# Patient Record
Sex: Male | Born: 1970 | Race: Black or African American | Hispanic: No | Marital: Single | Smoking: Current every day smoker
Health system: Southern US, Community
[De-identification: ages and names within clinical notes are randomized; demographics above are authoritative.]

## PROBLEM LIST (undated history)

## (undated) DIAGNOSIS — M549 Dorsalgia, unspecified: Secondary | ICD-10-CM

## (undated) HISTORY — PX: BACK SURGERY: SHX140

---

## 1998-11-02 ENCOUNTER — Emergency Department (HOSPITAL_COMMUNITY): Admission: EM | Admit: 1998-11-02 | Discharge: 1998-11-02 | Payer: Self-pay | Admitting: Emergency Medicine

## 1998-11-15 ENCOUNTER — Emergency Department (HOSPITAL_COMMUNITY): Admission: EM | Admit: 1998-11-15 | Discharge: 1998-11-15 | Payer: Self-pay | Admitting: Emergency Medicine

## 2001-03-26 ENCOUNTER — Emergency Department (HOSPITAL_COMMUNITY): Admission: EM | Admit: 2001-03-26 | Discharge: 2001-03-26 | Payer: Self-pay | Admitting: Emergency Medicine

## 2001-07-28 ENCOUNTER — Emergency Department (HOSPITAL_COMMUNITY): Admission: EM | Admit: 2001-07-28 | Discharge: 2001-07-28 | Payer: Self-pay | Admitting: Emergency Medicine

## 2002-11-23 ENCOUNTER — Emergency Department (HOSPITAL_COMMUNITY): Admission: EM | Admit: 2002-11-23 | Discharge: 2002-11-23 | Payer: Self-pay | Admitting: *Deleted

## 2003-01-21 ENCOUNTER — Emergency Department (HOSPITAL_COMMUNITY): Admission: EM | Admit: 2003-01-21 | Discharge: 2003-01-21 | Payer: Self-pay | Admitting: *Deleted

## 2003-04-07 ENCOUNTER — Emergency Department (HOSPITAL_COMMUNITY): Admission: EM | Admit: 2003-04-07 | Discharge: 2003-04-07 | Payer: Self-pay | Admitting: Emergency Medicine

## 2003-07-17 ENCOUNTER — Emergency Department (HOSPITAL_COMMUNITY): Admission: EM | Admit: 2003-07-17 | Discharge: 2003-07-18 | Payer: Self-pay | Admitting: *Deleted

## 2004-03-23 ENCOUNTER — Emergency Department (HOSPITAL_COMMUNITY): Admission: EM | Admit: 2004-03-23 | Discharge: 2004-03-23 | Payer: Self-pay | Admitting: Emergency Medicine

## 2004-06-07 ENCOUNTER — Emergency Department (HOSPITAL_COMMUNITY): Admission: EM | Admit: 2004-06-07 | Discharge: 2004-06-07 | Payer: Self-pay | Admitting: Emergency Medicine

## 2004-06-08 ENCOUNTER — Emergency Department (HOSPITAL_COMMUNITY): Admission: EM | Admit: 2004-06-08 | Discharge: 2004-06-08 | Payer: Self-pay | Admitting: Emergency Medicine

## 2005-10-31 ENCOUNTER — Ambulatory Visit: Payer: Self-pay | Admitting: Family Medicine

## 2005-10-31 ENCOUNTER — Observation Stay (HOSPITAL_COMMUNITY): Admission: EM | Admit: 2005-10-31 | Discharge: 2005-11-01 | Payer: Self-pay | Admitting: Emergency Medicine

## 2005-10-31 ENCOUNTER — Ambulatory Visit: Payer: Self-pay | Admitting: Cardiology

## 2005-11-01 ENCOUNTER — Emergency Department (HOSPITAL_COMMUNITY): Admission: EM | Admit: 2005-11-01 | Discharge: 2005-11-01 | Payer: Self-pay | Admitting: Emergency Medicine

## 2006-07-18 ENCOUNTER — Emergency Department (HOSPITAL_COMMUNITY): Admission: EM | Admit: 2006-07-18 | Discharge: 2006-07-18 | Payer: Self-pay | Admitting: Emergency Medicine

## 2006-07-19 ENCOUNTER — Emergency Department (HOSPITAL_COMMUNITY): Admission: EM | Admit: 2006-07-19 | Discharge: 2006-07-19 | Payer: Self-pay | Admitting: Emergency Medicine

## 2007-10-23 ENCOUNTER — Emergency Department (HOSPITAL_COMMUNITY): Admission: EM | Admit: 2007-10-23 | Discharge: 2007-10-23 | Payer: Self-pay | Admitting: Emergency Medicine

## 2008-01-23 ENCOUNTER — Emergency Department (HOSPITAL_COMMUNITY): Admission: EM | Admit: 2008-01-23 | Discharge: 2008-01-23 | Payer: Self-pay | Admitting: Emergency Medicine

## 2008-05-21 ENCOUNTER — Emergency Department (HOSPITAL_COMMUNITY): Admission: EM | Admit: 2008-05-21 | Discharge: 2008-05-21 | Payer: Self-pay | Admitting: Emergency Medicine

## 2008-07-27 ENCOUNTER — Emergency Department (HOSPITAL_COMMUNITY): Admission: EM | Admit: 2008-07-27 | Discharge: 2008-07-27 | Payer: Self-pay | Admitting: Emergency Medicine

## 2008-08-23 ENCOUNTER — Emergency Department (HOSPITAL_COMMUNITY): Admission: EM | Admit: 2008-08-23 | Discharge: 2008-08-23 | Payer: Self-pay | Admitting: Emergency Medicine

## 2008-10-18 ENCOUNTER — Emergency Department (HOSPITAL_COMMUNITY): Admission: EM | Admit: 2008-10-18 | Discharge: 2008-10-18 | Payer: Self-pay | Admitting: Emergency Medicine

## 2009-01-23 ENCOUNTER — Emergency Department (HOSPITAL_COMMUNITY): Admission: EM | Admit: 2009-01-23 | Discharge: 2009-01-23 | Payer: Self-pay | Admitting: Emergency Medicine

## 2010-08-24 ENCOUNTER — Emergency Department (HOSPITAL_COMMUNITY)
Admission: EM | Admit: 2010-08-24 | Discharge: 2010-08-24 | Payer: Self-pay | Source: Home / Self Care | Admitting: Emergency Medicine

## 2010-08-24 LAB — URINALYSIS, ROUTINE W REFLEX MICROSCOPIC
Bilirubin Urine: NEGATIVE
Hemoglobin, Urine: NEGATIVE
Ketones, ur: NEGATIVE mg/dL
Nitrite: NEGATIVE
Protein, ur: NEGATIVE mg/dL
Specific Gravity, Urine: 1.02 (ref 1.005–1.030)
Urine Glucose, Fasting: NEGATIVE mg/dL
Urobilinogen, UA: 0.2 mg/dL (ref 0.0–1.0)
pH: 6 (ref 5.0–8.0)

## 2010-08-24 LAB — BASIC METABOLIC PANEL
BUN: 9 mg/dL (ref 6–23)
CO2: 27 mEq/L (ref 19–32)
Calcium: 9.3 mg/dL (ref 8.4–10.5)
Chloride: 106 mEq/L (ref 96–112)
Creatinine, Ser: 0.72 mg/dL (ref 0.4–1.5)
GFR calc Af Amer: >60 mL/min (ref >60)
GFR calc non Af Amer: >60 mL/min (ref >60)
Glucose, Bld: 101 mg/dL — ABNORMAL HIGH (ref 70–99)
Potassium: 4 mEq/L (ref 3.5–5.1)
Sodium: 140 mEq/L (ref 135–145)

## 2010-08-24 LAB — CBC
HCT: 37.8 % — ABNORMAL LOW (ref 39.0–52.0)
Hemoglobin: 12.9 g/dL — ABNORMAL LOW (ref 13.0–17.0)
MCH: 30.1 pg (ref 26.0–34.0)
MCHC: 34.1 g/dL (ref 30.0–36.0)
MCV: 88.3 fL (ref 78.0–100.0)
Platelets: 185 10*3/uL (ref 150–400)
RBC: 4.28 MIL/uL (ref 4.22–5.81)
RDW: 12.8 % (ref 11.5–15.5)
WBC: 6.7 10*3/uL (ref 4.0–10.5)

## 2010-08-24 LAB — DIFFERENTIAL
Basophils Absolute: 0 10*3/uL (ref 0.0–0.1)
Basophils Relative: 0 % (ref 0–1)
Eosinophils Absolute: 0.2 10*3/uL (ref 0.0–0.7)
Eosinophils Relative: 3 % (ref 0–5)
Lymphocytes Relative: 24 % (ref 12–46)
Lymphs Abs: 1.6 10*3/uL (ref 0.7–4.0)
Monocytes Absolute: 0.5 10*3/uL (ref 0.1–1.0)
Monocytes Relative: 7 % (ref 3–12)
Neutro Abs: 4.4 10*3/uL (ref 1.7–7.7)
Neutrophils Relative %: 65 % (ref 43–77)

## 2010-09-17 ENCOUNTER — Emergency Department (HOSPITAL_COMMUNITY)
Admission: EM | Admit: 2010-09-17 | Discharge: 2010-09-17 | Payer: Self-pay | Source: Home / Self Care | Admitting: Emergency Medicine

## 2010-11-27 LAB — URINALYSIS, ROUTINE W REFLEX MICROSCOPIC
Bilirubin Urine: NEGATIVE
Glucose, UA: NEGATIVE mg/dL
Hgb urine dipstick: NEGATIVE
Ketones, ur: NEGATIVE mg/dL
Nitrite: NEGATIVE
Protein, ur: NEGATIVE mg/dL
Specific Gravity, Urine: 1.021 (ref 1.005–1.030)
Urobilinogen, UA: 1 mg/dL (ref 0.0–1.0)
pH: 6 (ref 5.0–8.0)

## 2010-11-27 LAB — DIFFERENTIAL
Basophils Absolute: 0 10*3/uL (ref 0.0–0.1)
Basophils Relative: 1 % (ref 0–1)
Eosinophils Absolute: 0.1 10*3/uL (ref 0.0–0.7)
Eosinophils Relative: 3 % (ref 0–5)
Lymphocytes Relative: 27 % (ref 12–46)
Lymphs Abs: 1.1 10*3/uL (ref 0.7–4.0)
Monocytes Absolute: 0.4 10*3/uL (ref 0.1–1.0)
Monocytes Relative: 9 % (ref 3–12)
Neutro Abs: 2.5 10*3/uL (ref 1.7–7.7)
Neutrophils Relative %: 61 % (ref 43–77)

## 2010-11-27 LAB — BASIC METABOLIC PANEL
BUN: 9 mg/dL (ref 6–23)
CO2: 27 mEq/L (ref 19–32)
Calcium: 9 mg/dL (ref 8.4–10.5)
Chloride: 103 mEq/L (ref 96–112)
Creatinine, Ser: 0.88 mg/dL (ref 0.4–1.5)
GFR calc Af Amer: >60 mL/min (ref >60)
GFR calc non Af Amer: >60 mL/min (ref >60)
Glucose, Bld: 88 mg/dL (ref 70–99)
Potassium: 3.6 mEq/L (ref 3.5–5.1)
Sodium: 135 mEq/L (ref 135–145)

## 2010-11-27 LAB — RAPID URINE DRUG SCREEN, HOSP PERFORMED
Amphetamines: NOT DETECTED
Barbiturates: NOT DETECTED
Benzodiazepines: NOT DETECTED
Cocaine: NOT DETECTED
Opiates: NOT DETECTED
Tetrahydrocannabinol: POSITIVE — AB

## 2010-11-27 LAB — CBC
HCT: 42.5 % (ref 39.0–52.0)
Hemoglobin: 14.8 g/dL (ref 13.0–17.0)
MCHC: 34.9 g/dL (ref 30.0–36.0)
MCV: 92.1 fL (ref 78.0–100.0)
Platelets: 174 10*3/uL (ref 150–400)
RBC: 4.62 MIL/uL (ref 4.22–5.81)
RDW: 13.1 % (ref 11.5–15.5)
WBC: 4.1 10*3/uL (ref 4.0–10.5)

## 2010-11-27 LAB — POCT CARDIAC MARKERS
CKMB, poc: <1 ng/mL — ABNORMAL LOW (ref 1.0–8.0)
Myoglobin, poc: 36.2 ng/mL (ref 12–200)
Troponin i, poc: <0.05 ng/mL (ref 0.00–0.09)

## 2010-12-04 LAB — RPR: RPR Ser Ql: NONREACTIVE

## 2010-12-04 LAB — URINALYSIS, ROUTINE W REFLEX MICROSCOPIC
Bilirubin Urine: NEGATIVE
Glucose, UA: NEGATIVE mg/dL
Hgb urine dipstick: NEGATIVE
Ketones, ur: 15 mg/dL — AB
Nitrite: NEGATIVE
Protein, ur: NEGATIVE mg/dL
Specific Gravity, Urine: 1.017 (ref 1.005–1.030)
Urobilinogen, UA: 1 mg/dL (ref 0.0–1.0)
pH: 7.5 (ref 5.0–8.0)

## 2010-12-04 LAB — GC/CHLAMYDIA PROBE AMP, GENITAL
Chlamydia, DNA Probe: NEGATIVE
GC Probe Amp, Genital: NEGATIVE

## 2011-01-05 NOTE — Discharge Summary (Signed)
NAMELEDARRIUS, BEAUCHAINE NO.:  [id number]   MEDICAL RECORD NO.:  [id number]          PATIENT TYPE:  INP   LOCATION:  3736                         FACILITY:  [facility]   PHYSICIAN:  Alanson Puls, M.D.    DATE OF BIRTH:  1971-05-05   DATE OF ADMISSION:  10/31/2005  DATE OF DISCHARGE:                                 DISCHARGE SUMMARY   ADDENDUM:  The patient's bradycardia was asymptomatic and there is no  history of syncopal episodes, however, an ACE level was checked prior to  discharge and is pending.  He will follow up with [facility] with  this issue.      Alanson Puls, M.D.     MR/MEDQ  D:  11/01/2005  T:  11/02/2005  Job:  [id number]

## 2011-01-05 NOTE — H&P (Signed)
[name], [name] NO.:  [id number]   MEDICAL RECORD NO.:  [id number]          PATIENT TYPE:  EMS   LOCATION:  MAJO                         FACILITY:  [facility]   PHYSICIAN:  Bryan Ortega, M.D.    DATE OF BIRTH:  1971/06/02   DATE OF ADMISSION:  10/31/2005  DATE OF DISCHARGE:                                HISTORY & PHYSICAL   CHIEF COMPLAINT:  Chest pain.   HISTORY OF PRESENT ILLNESS:  This is a 40 year old African-American male  with no known past medical history who presents to the emergency department  this morning with chest pain beginning at approximately 7 a.m.  It is now  currently 1:30 p.m.  The pain is described as a squeezing pain radiating to  the left arm and associated with diaphoresis and shortness of breath.  The  pain does worsen with deep inspiration.  The chest pain began while lifting  objects at work.  He has not had any nausea or vomiting and no recent  illnesses.  He does have a history of cocaine and marijuana usage with his  last use of cocaine reportedly approximately 1 week ago, which was a small  amount, worth $5.00.  He reports the pain is improved but not gone after  having one dose of IV Ativan.  It did not improve with sublingual  nitroglycerin.  He currently rates it as a 5/10.   ALLERGIES:  None known.   CURRENT MEDICATIONS:  None known.   PAST MEDICAL AND SURGICAL HISTORY:  None known.   FAMILY HISTORY:  Mom and brother with hypertension and hypercholesterolemia.  There is no history of coronary artery disease, CVA or myocardial  infarction.   SOCIAL HISTORY:  The patient lives in [place] with his girlfriend.  He  has 3 children in [place].  He works in a [employer].  Occasional cocaine,  and marijuana usage more regular.  Smokes approximately 4-5 cigarettes per  day.  He has a history of alcohol abuse; however, he reports quitting  alcohol usage approximately 8 months ago.   PHYSICAL EXAMINATION:  VITAL SIGNS:   Temperature of 98.3, blood pressure  119/77, pulse 48, respiratory rate 20, O2 saturation 99% on room air.  GENERAL:  He is alert, in no apparent distress.  HEENT:  Left pupil 4 mm, right pupil 3 mm; both reactive to light.  Oropharynx clear.  CARDIOVASCULAR:  Bradycardic with no murmurs, rubs, or gallops.  LUNGS:  Clear to auscultation bilaterally.  ABDOMEN:  Positive bowel sounds, soft, nontender, nondistended.  EXTREMITIES:  No edema.  NEUROLOGIC:  Grossly intact.  Cranial nerves intact.  EKG shows sinus  bradycardia with first degree AV block and nonspecific ST-T wave changes.   LABORATORY DATA:  White blood cell count of 5.1, hemoglobin of 14.6,  platelet count of 214.  D-dimer was less than 0.22.  UDS was positive for  cocaine and marijuana.  PT 13.1, INR 1.0, PTT 29.  Point of care markers  shows a myoglobin first of 80.1 dated _________then greater than 500.  CK-MB  was less than 1.0, then 2.0,  then 7.8.  Troponin was less than 0.05 x3.  Other labs are pending at the time of dictation.   ASSESSMENT AND PLAN:  A 40 year old African-American male with chest pain  and history of cocaine abuse.  1.  Chest pain, atypical in nature, with some pleuritic qualities to it, as      well.  Possible cardiac in nature, considering cocaine use and point of      care marker elevation.  Will begin heparin and consult cardiology.  May      need nitroglycerin drip, as the patient is still reporting chest pain.      Will get serial enzymes and repeat EKG and check fasting lipid profile      in the morning, as well.  2.  Substance abuse.  Will discuss smoking cessation with the patient and      consult social work for cocaine and marijuana usage.      Bonnee Quin, MD    ______________________________  Arnette Norris Sheffield Ortega, M.D.    AD/MEDQ  D:  10/31/2005  T:  10/31/2005  Job:  [id number]

## 2011-01-05 NOTE — Consult Note (Signed)
NAMETAMI, BARREN NO.:  [id number]   MEDICAL RECORD NO.:  [id number]          PATIENT TYPE:  EMS   LOCATION:  MAJO                         FACILITY:  [facility]   PHYSICIAN:  Olga Millers, M.D. LHCDATE OF BIRTH:  1971/01/01   DATE OF CONSULTATION:  10/31/2005  DATE OF DISCHARGE:                                   CONSULTATION   PRIMARY CARDIOLOGIST:  Olga Millers, M.D., new.   REASON FOR CONSULTATION:  Mr. Grafton is a 40 year old male with no prior  cardiac history who is now referred in consultation for evaluation of chest  pain.   The patient has no significant cardiac risk factors, except tobacco, but is  also found to be positive for cocaine by current urine drug screen.   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  While  lifting the door, however, he did not note any pulling sensation or pain.  It was only after he laid down the door that he felt this severe pain, which  he rated a 9/10, and which was associated with some mild dyspnea and  diaphoresis.  However, there was no radiation to the upper extremities or  jaw or any associated nausea/vomiting.   The patient was taken to the emergency room by one of his coworkers.  He was  treated with 1 sublingual nitroglycerin with no effect.  He has been here  since early this morning and is currently reporting improvement of the pain,  because he is laying still, to a current level of 2/10.   Admission electrocardiogram showed sinus bradycardia at 50 beats per minute  with first degree atrioventricular block and early repolarization changes.   Of note, the patient reports exacerbation of his chest pain if he takes a  deep breath or moves a certain way.  He denies any antecedent history of  exertional chest discomfort or dyspnea.   ALLERGIES:  No known drug allergies.   HOME MEDICATIONS:  None.   PAST  MEDICAL HISTORY:  Childhood murmur.  The patient denies any  hospitalizations or surgeries.   SOCIAL HISTORY:  The patient lives here in [place] with his girlfriend.  He has 3 children.  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  He has been smoking at least half a pack a day since the age of  66.  He has not had any alcohol for the last 8 months, but does admit to  drinking 4 beers a night in the past.  He denies history of abuse or  drinking hard liquor.   FAMILY HISTORY:  Mother is 85, present with him in the room, with no heart  disease.  Father died in his 29s, with no known heart disease.  Siblings  have no known heart disease.   REVIEW OF SYSTEMS:  As noted per HPI.  The patient denies any history of  exertional chest discomfort or dyspnea.  Denies paroxysmal nocturnal  dyspnea, orthopnea, lower extremity edema, tachypalpitations, upper or lower  GI bleeding, fever, chills, sweats or symptoms of reflux disease.  The  remaining systems are negative.   PHYSICAL EXAMINATION:  VITAL SIGNS:  Blood pressure of 119/77, pulse 48,  temperature 98.3, saturations 98% on room air on admission.  GENERAL:  A 40 year old male lying supine in bed, in no apparent distress.  HEENT:  Normocephalic and atraumatic.  NECK:  Preserved carotid pulses without bruits.  No JVD.  LUNGS:  Clear to auscultation in all fields.  HEART:  Regular rate and rhythm (S1 and S2).  No murmurs, rubs, or gallops.  ABDOMEN:  Soft, nontender.  Intact bowel sounds.  EXTREMITIES:  Palpable bilateral femoral pulses without bruits.  Intact  distal pulses.  No edema.  NEUROLOGIC:  No focal deficits.   ADMISSION CHEST X-RAY:  No acute disease.   ADMISSION ELECTROCARDIOGRAM:  Sinus bradycardia at 50 beats per minute with  normal axis.  Q waves in leads V1 and V2.  First degree AV block with a PR  interval of 321 ms.  ST changes suggestive of early repolarization.   LABORATORY DATA:  Initial serial  cardiac markers have been negative,  including the initial 2 sets of POC enzymes.  CBC is normal.  Sodium 136,  potassium  3.8, BUN 9, creatinine 0.9, glucose 84.  Mildly elevated SGPT at  44; otherwise normal.  Liver enzymes revealed an INR of 1.0.  D-dimer of  less than 0.22.  Urine drug screen positive for cocaine and cannabinoids.   IMPRESSION:  1.  Atypical chest pain.      1.  Most consistent with musculoskeletal etiology.  2.  Polysubstance abuse.      1.  Cocaine, tobacco.  3.  Sinus bradycardia/first degree atrioventricular block.   PLAN:  The patient's symptoms are quite atypical for ischemic heart disease.  However, in light of his history of cocaine abuse, he is at risk for  accelerated coronary atherosclerosis.  Therefore, the recommendation is to  admit for rule out of myocardial infarction, and, if serial cardiac markers  are negative, proceed with an outpatient stress Myoview study.  Regarding  medications, we recommend the addition of aspirin, but at this point see no  indication for intravenous heparin.  The patient will also need aggressive  counseling.  We agree with checking fasting lipid profile, repeating  electrocardiogram in the morning, and checking a TSH level, as well.  The  patient will also need aggressive counseling regarding polysubstance abuse.      Gene Serpe, P.A. [facility]    ______________________________  Olga Millers, M.D. [facility]    GS/MEDQ  D:  10/31/2005  T:  10/31/2005  Job:  [id number]

## 2011-01-05 NOTE — Discharge Summary (Signed)
NAMEDARRYEL, DIODATO NO.:  [id number]   MEDICAL RECORD NO.:  [id number]          PATIENT TYPE:  INP   LOCATION:  3736                         FACILITY:  [facility]   PHYSICIAN:  Wayne A. Sheffield Slider, M.D.    DATE OF BIRTH:  07/16/1971   DATE OF ADMISSION:  10/31/2005  DATE OF DISCHARGE:  11/01/2005                                 DISCHARGE SUMMARY   ADMISSION DIAGNOSES:  1.  Atypical chest pain.  2.  History of substance abuse, including cocaine and marijuana.   DISCHARGE DIAGNOSES:  1.  Atypical chest pain, likely musculoskeletal in origin.  2.  Substance abuse, including cocaine, marijuana, as well as tobacco abuse.   CONSULTATIONS:  [facility] on October 31, 2005.   PROCEDURES:  1.  Chest x-ray, which revealed no acute cardiopulmonary process.  2.  EKG which revealed sinus bradycardia with a rate of 40 beats per minute      with a primary AV block, P-R interval 322.  No previous EKG to compare.   HOSPITAL COURSE:  In brief, Mr. Hoffmann is a 40 year old male who was admitted  to the emergency department with substernal chest pain that was squeezing  and radiating to his left arm and associated with diaphoresis and shortness  of breath.  The patient states that the pain was worse with movement as well  as deep inspiration.  He states that the pain began while lifting some  objects at work.  He denies any nausea, vomiting, or recent illnesses but  did admit to cocaine and marijuana usage one week prior to this admission.  The patient states his pain was not relieved with nitroglycerin.  His  initial EKG showed sinus bradycardia with a rate of 40-45 beats per minute  with a primary AV block with a P-R interval of 322.  There were no acute ST  changes.  His EKG the next morning was also consistent with these findings.  The patient's initial point-of-care enzymes did increase while in the  emergency department; therefore, the patient was shortly placed on heparin  and examined by cardiology.  His official cardiac enzymes were as follows:  Troponin 0.01.  Second set 0.01.  Third set 0.01.  Therefore, this was not  considered an acute myocardial infarction.  The patient's initial chest x-  ray did not show any signs of active disease; therefore, his heparin was  discontinued because this was likely not a cardiac event.  Also, the  differential diagnosis included pulmonary embolism.  Patient's D-dimer was  less than 0.22, and he had a low pretest probability of pulmonary embolism.  In order to further risk stratify the patient, a fasting lipid panel was  obtained, which revealed a total cholesterol of 156, triglycerides 90, LDL  106, and HDL of 32.  In order to further characterize the bradycardia, the  patient's electrolytes were within normal limits, including sodium 138,  potassium 4.1, chloride 101, bicarb 28, BUN 10, creatinine 0.9, and a  glucose of 87.  A thyroid panel was checked, which revealed a normal thyroid  function at 4.384.  DISCHARGE MEDICATIONS:  Ibuprofen over-the-counter, per bottle instructions.   He was instructed to follow up with Dr. Jens Som of [facility] on  March 23 at 8:30 a.m. for a stress test treadmill and given the number, [phone]  [phone].  Social work consult was also obtained prior to discharge to encourage  the patient on smoking cessation as well as ways to deal with his substance  abuse.  He was instructed to return to the emergency department or clinic  for shortness of breath, persistent chest pain, or any other concerns.      Alanson Puls, M.D.    ______________________________  Arnette Norris. Sheffield Slider, M.D.    MR/MEDQ  D:  11/01/2005  T:  11/02/2005  Job:  [id number]

## 2011-05-14 ENCOUNTER — Emergency Department (HOSPITAL_COMMUNITY): Payer: Self-pay

## 2011-05-14 ENCOUNTER — Emergency Department (HOSPITAL_COMMUNITY)
Admission: EM | Admit: 2011-05-14 | Discharge: 2011-05-15 | Disposition: A | Discharge status: Home / Self Care | Payer: Self-pay | Attending: Emergency Medicine | Admitting: Emergency Medicine

## 2011-05-14 DIAGNOSIS — F17200 Nicotine dependence, unspecified, uncomplicated: Secondary | ICD-10-CM | POA: Insufficient documentation

## 2011-05-14 DIAGNOSIS — N509 Disorder of male genital organs, unspecified: Secondary | ICD-10-CM | POA: Insufficient documentation

## 2011-05-14 LAB — URINALYSIS, ROUTINE W REFLEX MICROSCOPIC
Bilirubin Urine: NEGATIVE
Glucose, UA: NEGATIVE mg/dL
Hgb urine dipstick: NEGATIVE
Ketones, ur: NEGATIVE mg/dL
Leukocytes, UA: NEGATIVE
Nitrite: NEGATIVE
Protein, ur: NEGATIVE mg/dL
Specific Gravity, Urine: 1.016 (ref 1.005–1.030)
Urobilinogen, UA: 0.2 mg/dL (ref 0.0–1.0)
pH: 6 (ref 5.0–8.0)

## 2011-07-16 ENCOUNTER — Emergency Department (HOSPITAL_COMMUNITY)
Admission: EM | Admit: 2011-07-16 | Discharge: 2011-07-16 | Disposition: A | Discharge status: Home / Self Care | Payer: Self-pay | Attending: Emergency Medicine | Admitting: Emergency Medicine

## 2011-07-16 ENCOUNTER — Encounter: Payer: Self-pay | Admitting: *Deleted

## 2011-07-16 DIAGNOSIS — M545 Low back pain, unspecified: Secondary | ICD-10-CM | POA: Insufficient documentation

## 2011-07-16 DIAGNOSIS — M79609 Pain in unspecified limb: Secondary | ICD-10-CM | POA: Insufficient documentation

## 2011-07-16 DIAGNOSIS — M25569 Pain in unspecified knee: Secondary | ICD-10-CM | POA: Insufficient documentation

## 2011-07-16 DIAGNOSIS — M79605 Pain in left leg: Secondary | ICD-10-CM

## 2011-07-16 MED ORDER — IBUPROFEN 800 MG PO TABS
ORAL_TABLET | ORAL | Status: AC
  Administered 2011-07-16: 800 mg via ORAL
  Filled 2011-07-16: qty 1

## 2011-07-16 MED ORDER — IBUPROFEN 800 MG PO TABS: 800.0000 mg | ORAL_TABLET | Freq: Three times a day (TID) | ORAL | Status: DC | PRN

## 2011-07-16 MED ORDER — CYCLOBENZAPRINE HCL 10 MG PO TABS: 10.0000 mg | ORAL_TABLET | Freq: Two times a day (BID) | ORAL | Status: DC | PRN

## 2011-07-16 NOTE — ED Notes (Signed)
Reports lower back pain and left leg pain, denies injury to back. Ambulatory at triage.

## 2011-07-16 NOTE — ED Provider Notes (Signed)
History    patient presents to the ED with a chief complaints of left thigh pain. Patient states for the past week he's been noticing pain from left thigh/knee radiating to his left low back. Pain is throbbing and achy and worsening with position change. Patient notice more pain with sitting or bending.  He denies dysuria, rash, recent trauma, numbness or weakness. He denies midback pain, fever, abdominal pain. He has tried taking saw him ibuprofen without relief. He does notice increasing pain with movement.  CSN: [id number] Arrival date & time: 07/16/2011  8:58 AM   First MD Initiated Contact with Patient 07/16/11 [phone]      Chief Complaint  Patient presents with  . Back Pain  . Leg Pain    (Consider location/radiation/quality/duration/timing/severity/associated sxs/prior treatment) Patient is a 40 y.o. male presenting with leg pain. The history is provided by the patient. No language interpreter was used.  Leg Pain  The incident occurred more than 1 week ago. There was no injury mechanism. The pain is present in the left thigh. The quality of the pain is described as aching and throbbing. The pain is at a severity of 5/10. The pain is moderate. The pain has been intermittent since onset. Pertinent negatives include no numbness, no inability to bear weight, no loss of motion, no muscle weakness, no loss of sensation and no tingling. He has tried NSAIDs for the symptoms. The treatment provided mild relief.    History reviewed. No pertinent past medical history.  History reviewed. No pertinent past surgical history.  History reviewed. No pertinent family history.  History  Substance Use Topics  . Smoking status: Not on file  . Smokeless tobacco: Not on file  . Alcohol Use: No      Review of Systems  Neurological: Negative for tingling and numbness.  All other systems reviewed and are negative.    Allergies  Review of patient's allergies indicates no known allergies.  Home  Medications  No current outpatient prescriptions on file.  BP 120/65  Pulse 51  Temp(Src) 97 F (36.1 C) (Oral)  Resp 20  SpO2 100%  Physical Exam  Nursing note and vitals reviewed. Constitutional: He is oriented to person, place, and time. He appears well-developed and well-nourished.       Awake, alert, nontoxic appearance  HENT:  Head: Atraumatic.  Eyes: Right eye exhibits no discharge. Left eye exhibits no discharge.  Neck: Neck supple.  Pulmonary/Chest: Effort normal. He exhibits no tenderness.  Abdominal: Soft. Bowel sounds are normal. There is no tenderness. There is no rebound.  Musculoskeletal: Normal range of motion. He exhibits no tenderness.       Left hip: Normal.       Left knee: Normal.       Lumbar back: Normal.       Left upper leg: He exhibits tenderness. He exhibits no bony tenderness, no swelling, no deformity and no laceration.       Baseline ROM, no obvious new focal weakness  Neurological: He is oriented to person, place, and time. He displays normal reflexes. He exhibits normal muscle tone. Coordination normal.       Mental status and motor strength appears baseline for patient and situation  Skin: No rash noted.  Psychiatric: He has a normal mood and affect.    ED Course  Procedures (including critical care time)  Labs Reviewed - No data to display No results found.   No diagnosis found.    MDM  Patient with  left thigh pain reproducible with range of motion. He has no lumbar midline tenderness. He has no complaints of urinary symptoms or evidence of hernia. No CVA tenderness. Pain is likely musculoskeletal. Patient has normal patellar deep tendon reflex and no evidence of foot drop. I will prescribe NSAIDs and muscle relaxant. Patient is afebrile.      Fayrene Helper, PA 07/16/11 1012

## 2011-07-16 NOTE — ED Provider Notes (Signed)
Medical screening examination/treatment/procedure(s) were performed by non-physician practitioner and as supervising physician I was immediately available for consultation/collaboration.    Valiant Dills R Meliyah Simon, MD 07/16/11 1646 

## 2011-07-18 ENCOUNTER — Encounter (HOSPITAL_COMMUNITY): Payer: Self-pay | Admitting: *Deleted

## 2011-07-18 ENCOUNTER — Emergency Department (HOSPITAL_COMMUNITY): Payer: Self-pay

## 2011-07-18 ENCOUNTER — Emergency Department (HOSPITAL_COMMUNITY)
Admission: EM | Admit: 2011-07-18 | Discharge: 2011-07-18 | Disposition: A | Discharge status: Home / Self Care | Payer: Self-pay | Attending: Emergency Medicine | Admitting: Emergency Medicine

## 2011-07-18 DIAGNOSIS — IMO0001 Reserved for inherently not codable concepts without codable children: Secondary | ICD-10-CM | POA: Insufficient documentation

## 2011-07-18 DIAGNOSIS — M79609 Pain in unspecified limb: Secondary | ICD-10-CM | POA: Insufficient documentation

## 2011-07-18 DIAGNOSIS — M5430 Sciatica, unspecified side: Secondary | ICD-10-CM | POA: Insufficient documentation

## 2011-07-18 MED ORDER — DIAZEPAM 5 MG PO TABS: 5.0000 mg | ORAL_TABLET | Freq: Two times a day (BID) | ORAL | Status: DC

## 2011-07-18 MED ORDER — PREDNISONE 10 MG PO TABS: 20.0000 mg | ORAL_TABLET | Freq: Every day | ORAL | Status: DC

## 2011-07-18 MED ORDER — DEXAMETHASONE SODIUM PHOSPHATE 10 MG/ML IJ SOLN
10.0000 mg | Freq: Once | INTRAMUSCULAR | Status: AC
  Administered 2011-07-18: 10 mg via INTRAMUSCULAR
  Filled 2011-07-18: qty 1

## 2011-07-18 MED ORDER — DIAZEPAM 5 MG/ML IJ SOLN
5.0000 mg | Freq: Once | INTRAMUSCULAR | Status: AC
  Administered 2011-07-18: 5 mg via INTRAMUSCULAR
  Filled 2011-07-18: qty 2

## 2011-07-18 NOTE — ED Notes (Signed)
Pt has taken all 10 Flexeril since seen 2 days ago without relief. No known injury to left leg--was told it 'was muscles'.

## 2011-07-18 NOTE — ED Notes (Signed)
C/o leg pain, onset 2 weeks ago, was seen here 2d ago, pinpoint pain to L posterior thigh up to L buttocks. Has taken prescribed meds: ibuprofen and flexeril.

## 2011-07-18 NOTE — ED Notes (Signed)
REPORTED OFF TO FOLLOWING SHIFT.

## 2011-07-18 NOTE — ED Provider Notes (Signed)
Medical screening examination/treatment/procedure(s) were performed by non-physician practitioner and as supervising physician I was immediately available for consultation/collaboration.  Fletcher Rathbun L Retia Cordle, MD 07/18/11 2053 

## 2011-07-18 NOTE — ED Provider Notes (Signed)
History     CSN: [id number] Arrival date & time: 07/18/2011  5:13 AM   First MD Initiated Contact with Patient 07/18/11 [phone]      Chief Complaint  Patient presents with  . Leg Pain    (Consider location/radiation/quality/duration/timing/severity/associated sxs/prior treatment) HPI Comments: Patient here with worsening posterior left leg pain - states has been here 2 days prior for same symptoms - reports crampy pain to posterior leg from buttocks to knee - denies any calf pain - no improvement with medications - reports pain worse with movement, but episodic in nature.  Patient is a 40 y.o. male presenting with leg pain. The history is provided by the patient. No language interpreter was used.  Leg Pain  The incident occurred more than 2 days ago. The incident occurred at home. There was no injury mechanism. The pain is present in the left leg. The quality of the pain is described as burning and sharp. The pain is at a severity of 5/10. The pain is mild. The pain has been fluctuating since onset. Pertinent negatives include no numbness, no inability to bear weight, no loss of motion, no muscle weakness, no loss of sensation and no tingling. He reports no foreign bodies present. The symptoms are aggravated by nothing. He has tried NSAIDs for the symptoms. The treatment provided no relief.    History reviewed. No pertinent past medical history.  History reviewed. No pertinent past surgical history.  History reviewed. No pertinent family history.  History  Substance Use Topics  . Smoking status: Not on file  . Smokeless tobacco: Not on file  . Alcohol Use: No      Review of Systems  Neurological: Negative for tingling and numbness.  All other systems reviewed and are negative.    Allergies  Review of patient's allergies indicates no known allergies.  Home Medications   Current Outpatient Rx  Name Route Sig Dispense Refill  . CYCLOBENZAPRINE HCL 10 MG PO TABS Oral Take 1  tablet (10 mg total) by mouth 2 (two) times daily as needed for muscle spasms. 10 tablet 0  . IBUPROFEN 800 MG PO TABS Oral Take 1 tablet (800 mg total) by mouth every 8 (eight) hours as needed for pain. 30 tablet 0    BP 118/78  Pulse 69  Temp(Src) 97.6 F (36.4 C) (Oral)  Resp 20  SpO2 100%  Physical Exam  Nursing note and vitals reviewed. Constitutional: He is oriented to person, place, and time. He appears well-developed and well-nourished.  HENT:  Head: Normocephalic and atraumatic.  Right Ear: External ear normal.  Left Ear: External ear normal.  Mouth/Throat: Oropharynx is clear and moist.  Eyes: Conjunctivae are normal. Pupils are equal, round, and reactive to light.  Neck: Normal range of motion. Neck supple.  Cardiovascular: Normal rate, regular rhythm and normal heart sounds.   Pulmonary/Chest: Effort normal and breath sounds normal.  Abdominal: Soft. Bowel sounds are normal.  Musculoskeletal: Normal range of motion.       Left upper leg: He exhibits tenderness. He exhibits no bony tenderness, no swelling, no edema and no deformity.       Legs: Neurological: He is alert and oriented to person, place, and time.  Skin: Skin is warm and dry.  Psychiatric: He has a normal mood and affect. His behavior is normal. Judgment and thought content normal.    ED Course  Procedures (including critical care time)  Labs Reviewed - No data to display No results found.  Sciatica   MDM  Tenderness to muscle with movement - no SI joint tenderness but the pain starts in the buttocks suggesting sciatica in nature - will get lower back x-ray to make sure no pathology noted there.  Doubt DVT because of the episodic nature of the pain, and the cramping.    Patient reports no improvement in pain despite valium and steroids - still believe this to be sciatica - will continues current course, steroids.    Izola Price [place], [place] 07/18/11 [phone]

## 2011-07-19 ENCOUNTER — Encounter (HOSPITAL_COMMUNITY): Payer: Self-pay | Admitting: *Deleted

## 2011-07-19 ENCOUNTER — Emergency Department (HOSPITAL_COMMUNITY)
Admission: EM | Admit: 2011-07-19 | Discharge: 2011-07-19 | Disposition: A | Discharge status: Home / Self Care | Payer: Self-pay | Attending: Emergency Medicine | Admitting: Emergency Medicine

## 2011-07-19 DIAGNOSIS — M545 Low back pain, unspecified: Secondary | ICD-10-CM | POA: Insufficient documentation

## 2011-07-19 DIAGNOSIS — M79605 Pain in left leg: Secondary | ICD-10-CM

## 2011-07-19 DIAGNOSIS — M79609 Pain in unspecified limb: Secondary | ICD-10-CM | POA: Insufficient documentation

## 2011-07-19 MED ORDER — OXYCODONE-ACETAMINOPHEN 5-325 MG PO TABS: ORAL_TABLET | ORAL | Status: DC

## 2011-07-19 NOTE — ED Notes (Signed)
Pt is here for re evaluation of left leg pain.  Pt states that it continues to get worse.  No swelling to leg, not associated with any trauma.  Pt is ambulatory.  No fever

## 2011-07-19 NOTE — ED Provider Notes (Signed)
History     CSN: [id number] Arrival date & time: 07/19/2011  5:25 PM   First MD Initiated Contact with Patient 07/19/11 2032      Chief Complaint  Patient presents with  . Leg Pain    (Consider location/radiation/quality/duration/timing/severity/associated sxs/prior treatment) Patient is a 40 y.o. male presenting with leg pain. The history is provided by the patient.  Leg Pain  The incident occurred more than 2 days ago. Incident location: No known injury. There was no injury mechanism. The pain is present in the left thigh. The pain is at a severity of 10/10. The pain is moderate. Pertinent negatives include no numbness. Exacerbated by: Nothing makes it worse or better - pain is constant.     History reviewed. No pertinent past medical history.  History reviewed. No pertinent past surgical history.  History reviewed. No pertinent family history.  History  Substance Use Topics  . Smoking status: Former Games developer  . Smokeless tobacco: Not on file  . Alcohol Use: No      Review of Systems  Constitutional: Negative for fever and chills.  HENT: Negative.   Respiratory: Negative.   Cardiovascular: Negative.   Gastrointestinal: Negative.   Musculoskeletal: Negative.        See HPI  Skin: Negative.   Neurological: Negative.  Negative for numbness.    Allergies  Review of patient's allergies indicates no known allergies.  Home Medications   Current Outpatient Rx  Name Route Sig Dispense Refill  . CYCLOBENZAPRINE HCL 10 MG PO TABS Oral Take 10 mg by mouth 2 (two) times daily as needed. For muscle spasms     . DIAZEPAM 5 MG PO TABS Oral Take 5 mg by mouth 2 (two) times daily.      . IBUPROFEN 800 MG PO TABS Oral Take 800 mg by mouth every 8 (eight) hours as needed. For pain     . ICY HOT 10-30 % EX STCK Apply externally Apply 1 application topically 2 (two) times daily.      Marland Kitchen PREDNISONE 10 MG PO TABS Oral Take 20 mg by mouth daily.        BP 111/67  Pulse 94   Temp(Src) 98 F (36.7 C) (Oral)  Resp 14  SpO2 96%  Physical Exam  Constitutional: He is oriented to person, place, and time. He appears well-developed and well-nourished.  Neck: Normal range of motion.  Pulmonary/Chest: Effort normal.  Abdominal: Soft. He exhibits no mass. There is no tenderness.  Musculoskeletal: Normal range of motion.       Left lateral paralumbar tenderness without swelling, discoloration. No sciatic tenderness on left. Distal pulses 2+. No swelling of lower extremity.  Neurological: He is alert and oriented to person, place, and time. He has normal reflexes. No sensory deficit.  Skin: Skin is warm and dry.  Psychiatric: He has a normal mood and affect.    ED Course  Procedures (including critical care time)  Labs Reviewed - No data to display Dg Lumbar Spine Complete  07/18/2011  *RADIOLOGY REPORT*  Clinical Data: Low back and left leg pain.  LUMBAR SPINE - COMPLETE 4+ VIEW  Comparison: None.  Findings: There is mild straightening of the normal lumbar lordosis.  Alignment is otherwise anatomic.  Vertebral body and disc space height are maintained.  No significant degenerative changes.  No pars defects.  IMPRESSION: Negative.  Original Report Authenticated By: Reyes Ivan, M.D.     No diagnosis found.    MDM  The patient  has had multiple ED evaluations this week for same pain and is not improved with any medications. Plan is to refer to Dr. Luiz Blare for outpatient evaluation and will schedule outpatient MRI of lumbar spine for Dr. Luiz Blare to follow up on results.        Rodena Medin, PA 07/19/11 2124

## 2011-07-19 NOTE — ED Notes (Signed)
Pt wants to talk to supervisor regarding the care he has received. States that pain pills are doing nothing for his leg pain

## 2011-07-19 NOTE — ED Provider Notes (Signed)
Medical screening examination/treatment/procedure(s) were performed by non-physician practitioner and as supervising physician I was immediately available for consultation/collaboration.  Lylian Sanagustin K Linker, MD 07/19/11 2209 

## 2011-07-21 ENCOUNTER — Ambulatory Visit (HOSPITAL_COMMUNITY)
Admission: RE | Admit: 2011-07-21 | Discharge: 2011-07-21 | Disposition: A | Discharge status: Home / Self Care | Payer: Medicaid Other | Source: Ambulatory Visit | Attending: Emergency Medicine | Admitting: Emergency Medicine

## 2011-07-21 ENCOUNTER — Other Ambulatory Visit (HOSPITAL_COMMUNITY): Payer: Self-pay | Admitting: Emergency Medicine

## 2011-07-21 ENCOUNTER — Encounter (HOSPITAL_COMMUNITY): Payer: Self-pay

## 2011-07-21 ENCOUNTER — Emergency Department (HOSPITAL_COMMUNITY)
Admission: EM | Admit: 2011-07-21 | Discharge: 2011-07-21 | Disposition: A | Discharge status: Home / Self Care | Payer: Self-pay | Attending: Emergency Medicine | Admitting: Emergency Medicine

## 2011-07-21 DIAGNOSIS — M5430 Sciatica, unspecified side: Secondary | ICD-10-CM | POA: Insufficient documentation

## 2011-07-21 DIAGNOSIS — M5417 Radiculopathy, lumbosacral region: Secondary | ICD-10-CM

## 2011-07-21 DIAGNOSIS — M545 Low back pain, unspecified: Secondary | ICD-10-CM | POA: Insufficient documentation

## 2011-07-21 DIAGNOSIS — M5126 Other intervertebral disc displacement, lumbar region: Secondary | ICD-10-CM | POA: Insufficient documentation

## 2011-07-21 DIAGNOSIS — M79609 Pain in unspecified limb: Secondary | ICD-10-CM | POA: Insufficient documentation

## 2011-07-21 DIAGNOSIS — IMO0002 Reserved for concepts with insufficient information to code with codable children: Secondary | ICD-10-CM | POA: Insufficient documentation

## 2011-07-21 DIAGNOSIS — E876 Hypokalemia: Secondary | ICD-10-CM | POA: Insufficient documentation

## 2011-07-21 LAB — COMPREHENSIVE METABOLIC PANEL
ALT: 14 U/L (ref 0–53)
AST: 9 U/L (ref 0–37)
Albumin: 3.5 g/dL (ref 3.5–5.2)
CO2: 28 mEq/L (ref 19–32)
Calcium: 9.3 mg/dL (ref 8.4–10.5)
Chloride: 105 mEq/L (ref 96–112)
Creatinine, Ser: 0.91 mg/dL (ref 0.50–1.35)
GFR calc non Af Amer: >90 mL/min (ref >90)
Sodium: 140 mEq/L (ref 135–145)
Total Bilirubin: 0.2 mg/dL — ABNORMAL LOW (ref 0.3–1.2)

## 2011-07-21 LAB — DIFFERENTIAL
Basophils Absolute: 0 10*3/uL (ref 0.0–0.1)
Basophils Relative: 0 % (ref 0–1)
Lymphocytes Relative: 21 % (ref 12–46)
Neutro Abs: 7.6 10*3/uL (ref 1.7–7.7)

## 2011-07-21 LAB — CBC
MCHC: 35.3 g/dL (ref 30.0–36.0)
Platelets: 173 10*3/uL (ref 150–400)
RDW: 13 % (ref 11.5–15.5)
WBC: 10.9 10*3/uL — ABNORMAL HIGH (ref 4.0–10.5)

## 2011-07-21 MED ORDER — SODIUM CHLORIDE 0.9 % IV SOLN
Freq: Once | INTRAVENOUS | Status: AC
  Administered 2011-07-21: 08:00:00 via INTRAVENOUS

## 2011-07-21 MED ORDER — FENTANYL CITRATE 0.05 MG/ML IJ SOLN
100.0000 ug | Freq: Once | INTRAMUSCULAR | Status: AC
  Administered 2011-07-21: 100 ug via INTRAVENOUS
  Filled 2011-07-21: qty 2

## 2011-07-21 MED ORDER — PREDNISONE 20 MG PO TABS: ORAL_TABLET | ORAL | Status: DC

## 2011-07-21 MED ORDER — DIAZEPAM 5 MG PO TABS: ORAL_TABLET | ORAL | Status: DC

## 2011-07-21 MED ORDER — HYDROMORPHONE HCL 2 MG PO TABS
2.0000 mg | ORAL_TABLET | ORAL | Status: DC | PRN
Start: 2011-07-21 — End: 2011-07-25

## 2011-07-21 MED ORDER — DEXAMETHASONE SODIUM PHOSPHATE 10 MG/ML IJ SOLN
10.0000 mg | Freq: Once | INTRAMUSCULAR | Status: AC
  Administered 2011-07-21: 10 mg via INTRAVENOUS
  Filled 2011-07-21: qty 1

## 2011-07-21 MED ORDER — ONDANSETRON HCL 4 MG/2ML IJ SOLN
4.0000 mg | Freq: Once | INTRAMUSCULAR | Status: AC
  Administered 2011-07-21: 4 mg via INTRAVENOUS
  Filled 2011-07-21: qty 2

## 2011-07-21 NOTE — ED Notes (Signed)
Pt hypotensive.  Notified. Dr. Lynelle Doctor.  OK to give fentanyl per Dr. Lynelle Doctor. Also administering 1Liter bolus nss.   Pt reports took several percocet during the night.  EDP aware.

## 2011-07-21 NOTE — ED Notes (Signed)
Pt reports woke up Monday with pain in posterior left thigh.  Denies injury.  Says feels like has a cramp in his thigh.  Reports went to [facility] and was given pain meds but says nothing is helping.

## 2011-07-21 NOTE — ED Notes (Signed)
Dr. Lynelle Doctor aware of increase in bp and is aware pain is unchanged.

## 2011-07-21 NOTE — ED Notes (Signed)
Pt c/o left thigh pain since Monday.  Says feels like a cramp that won't go away.  Reports no injury. Pedal pulses present, skin warm and dry.

## 2011-07-21 NOTE — ED Provider Notes (Signed)
History  Scribed for Ward Givens, MD, the patient was [facility]. The chart was scribed by Gilman Schmidt. The patients care was started at 9:30 AM.   CSN: [id number] Arrival date & time: 07/21/2011  7:26 AM   First MD Initiated Contact with Patient 07/21/11 0740      Chief Complaint  Patient presents with  . Leg Pain     HPI Bryan Ortega is a 40 y.o. male who presents to the Emergency Department complaining of leg pain. Pt reports waking up Monday with pain radiating from buttock into left posterior thigh and down to calf. Pain is described as a cramp sensation. Symptoms are exacerbated upon laying on left  side, moving, and walking. Symptoms are alleviated by laying on opposite side. States that pain did not wake him from sleep. Denies any injury. Pt states recent visit to [facility] on November when he 6, November 28, and November 29, with pain meds that have not brough relief. Patient had LS spine x-rays done which were benign. Again these records it appeared he was scheduled for MRI of his lumbar spine that he possibly left the ER before getting it done.  Pt has never had similar symptoms. States there have not been any new change in activity. Denies any back pain or fever. There are no other associated symptoms and no other alleviating or aggravating factors. No rectal or urinary  incontinence she relates the medicines he was giving are not helping.   PCP: none   History reviewed. No pertinent past medical history.  History reviewed. No pertinent past surgical history.  No family history on file.  History  Substance Use Topics  . Smoking status: Yes, quit last month  . Smokeless tobacco: Not on file  . Alcohol Use: No  Stopped drinking this year Not employed    Review of Systems  Constitutional: Negative for fever.  Musculoskeletal: Negative for back pain.       Leg Pain  All other systems reviewed and are negative.    Allergies  Review of patient's allergies  indicates no known allergies.  Home Medications   Current Outpatient Rx  Name Route Sig Dispense Refill  . CYCLOBENZAPRINE HCL 10 MG PO TABS Oral Take 10 mg by mouth 2 (two) times daily as needed. For muscle spasms     . DIAZEPAM 5 MG PO TABS Oral Take 5 mg by mouth 2 (two) times daily.      . IBUPROFEN 800 MG PO TABS Oral Take 800 mg by mouth every 8 (eight) hours as needed. For pain     . ICY HOT 10-30 % EX STCK Apply externally Apply 1 application topically 2 (two) times daily.      . OXYCODONE-ACETAMINOPHEN 5-325 MG PO TABS  ONE TO TWO TABLETS Q 4-6 HOURS PRN PAIN 25 tablet 0  . PREDNISONE 10 MG PO TABS Oral Take 20 mg by mouth daily.        BP 113/82  Pulse 62  Temp(Src) 98.9 F (37.2 C) (Oral)  Resp 18  Ht 6\' 1"  (1.854 m)  Wt 160 lb (72.576 kg)  BMI 21.11 kg/m2  SpO2 100% Vital signs normal  Physical Exam  Nursing note and vitals reviewed. Constitutional: He is oriented to person, place, and time. He appears well-developed and well-nourished.  Non-toxic appearance. He does not have a sickly appearance.  HENT:  Head: Normocephalic and atraumatic.  Mouth/Throat: Posterior oropharyngeal erythema present.  Eyes: Conjunctivae, EOM and lids are normal.  Pupils are equal, round, and reactive to light.  Neck: Trachea normal, normal range of motion and full passive range of motion without pain. Neck supple.  Cardiovascular: Regular rhythm and normal heart sounds.   Pulmonary/Chest: Effort normal and breath sounds normal. No respiratory distress.  Abdominal: Soft. Normal appearance. He exhibits no distension. There is no tenderness. There is no rebound and no CVA tenderness.  Musculoskeletal: Normal range of motion.       Lower lumbar and sacral tenderness diffusely Sciatic notch tenderness of left buttocks Thigh tenderness diffusely Spasm on dorsal thigh muscles on the left. Pt laying on his right side appears uncomfortable to move.  Patellar reflexes hyperreflexic bilaterally    Neurological: He is alert and oriented to person, place, and time. He has normal strength.  Skin: Skin is warm, dry and intact. No rash noted.    ED Course  Procedures (including critical care time)  Results for orders placed during the hospital encounter of 07/21/11  CBC      Component Value Range   WBC 10.9 (*) 4.0 - 10.5 (K/uL)   RBC 4.43  4.22 - 5.81 (MIL/uL)   Hemoglobin 13.6  13.0 - 17.0 (g/dL)   HCT 16.1 (*) 09.6 - 52.0 (%)   MCV 86.9  78.0 - 100.0 (fL)   MCH 30.7  26.0 - 34.0 (pg)   MCHC 35.3  30.0 - 36.0 (g/dL)   RDW 04.5  40.9 - 81.1 (%)   Platelets 173  150 - 400 (K/uL)  DIFFERENTIAL      Component Value Range   Neutrophils Relative 70  43 - 77 (%)   Neutro Abs 7.6  1.7 - 7.7 (K/uL)   Lymphocytes Relative 21  12 - 46 (%)   Lymphs Abs 2.3  0.7 - 4.0 (K/uL)   Monocytes Relative 8  3 - 12 (%)   Monocytes Absolute 0.9  0.1 - 1.0 (K/uL)   Eosinophils Relative 0  0 - 5 (%)   Eosinophils Absolute 0.0  0.0 - 0.7 (K/uL)   Basophils Relative 0  0 - 1 (%)   Basophils Absolute 0.0  0.0 - 0.1 (K/uL)  COMPREHENSIVE METABOLIC PANEL      Component Value Range   Sodium 140  135 - 145 (mEq/L)   Potassium 3.3 (*) 3.5 - 5.1 (mEq/L)   Chloride 105  96 - 112 (mEq/L)   CO2 28  19 - 32 (mEq/L)   Glucose, Bld 93  70 - 99 (mg/dL)   BUN 12  6 - 23 (mg/dL)   Creatinine, Ser 9.14  0.50 - 1.35 (mg/dL)   Calcium 9.3  8.4 - 78.2 (mg/dL)   Total Protein 6.3  6.0 - 8.3 (g/dL)   Albumin 3.5  3.5 - 5.2 (g/dL)   AST 9  0 - 37 (U/L)   ALT 14  0 - 53 (U/L)   Alkaline Phosphatase 42  39 - 117 (U/L)   Total Bilirubin 0.2 (*) 0.3 - 1.2 (mg/dL)   GFR calc non Af Amer >90  >90 (mL/min)   GFR calc Af Amer >90  >90 (mL/min)    My laboratory interpretation mild hypokalemia otherwise normal Dg Lumbar Spine Complete  07/18/2011  *RADIOLOGY REPORT*  Clinical Data: Low back and left leg pain.  LUMBAR SPINE - COMPLETE 4+ VIEW  Comparison: None.  Findings: There is mild straightening of the normal  lumbar lordosis.  Alignment is otherwise anatomic.  Vertebral body and disc space height are maintained.  No significant  degenerative changes.  No pars defects.  IMPRESSION: Negative.  Original Report Authenticated By: Reyes Ivan, M.D.      DIAGNOSTIC STUDIES: Oxygen Saturation is 100% on room air, normal by my interpretation.    COORDINATION OF CARE: 7:41am:  - Patient evaluated by ED physician, Sublimaze, Zofran, Decadron, MR Lumbar, CBC, Diff, CMP ordered  Patient had episode of hypotension she was treated with IV fluids.  MRI ordered here but not available today at [facility] We are talking to MRI at [facility] to see if they can do his MRI today as an outpatient.   9:50am: Recheck by EDP. Blood Pressure Levels checked. 113/86  Pt discharged to go to [facility] hospital to get outpatient MRI of his Lumbar spine. I have his phone number to call with results today.   12:41 Dr Benard Rink, radiology called MRI results, he has a herniated disc at L5-S1 with involvement of the left nerve S1 root, no cauda equina  12:52 have given results to patient /girlfriend, they understand his new medications and need to follow up with Dr Newell Coral, they are to call his office on Monday (2 days)  Mr Lumbar Spine Wo Contrast  07/21/2011  *RADIOLOGY REPORT*  Clinical Data: Pain radiating into the left leg for 5 days.  MRI LUMBAR SPINE WITHOUT CONTRAST  Technique:  Multiplanar and multiecho pulse sequences of the lumbar spine were obtained without intravenous contrast.  Comparison: Plain films 07/18/2011.  Findings: Anatomic alignment is present.  Diffusely low signal marrow on STIR images without foci of marrow replacement.  This could be secondary to chronic disease, anemia, or current/previous smoking.  Correlate clinically.  Markedly enlarged bladder without pelvic mass.  It is unclear if the patient has urinary retention perhaps due to prostate disease. There is no cauda equina or conus compression.  Discal dehydration at  L3-4, L4-5, and L5-S1 without significant loss of disc height.  Mild endplate reactive changes L5-S1.  Normal conus.  No prevertebral or paraspinous masses.  The individual disc spaces are examined as follows:  L1-2:  Normal.  L2-3:  Normal.  L3-4:  Mild facet arthropathy.  Tiny central annular rent without impingement.  L4-5:  Mild facet arthropathy.  Shallow central and rightward protrusion.  Possible right L5 nerve root impingement.  L5-S1:  Focal disc extrusion at L5-S1 on the left.  Significant compression of the left S1 nerve root.  Mild superimposed facet arthropathy.  Far right and left  parasagittal images do not show significant foraminal narrowing.  IMPRESSION: The dominant left-sided abnormality is at L5-S1 where a moderate sized focal disc extrusion compresses the left S1 nerve root.  Shallow protrusion at L4-5 on the right could compress the right L5 nerve root.  Nonspecific low signal marrow on sagittal IR sequence which could reflect anemia or smoking.  Original Report Authenticated By: Elsie Stain, M.D.    Diagnoses that have been ruled out:  Diagnoses that are still under consideration:  Final diagnoses:  Sciatica  Herniated lumbar intervertebral disc  L-S radiculopathy     New Prescriptions   DIAZEPAM (VALIUM) 5 MG TABLET    Take 1 po TID   HYDROMORPHONE (DILAUDID) 2 MG TABLET    Take 1 tablet (2 mg total) by mouth every 4 (four) hours as needed for pain.   PREDNISONE (DELTASONE) 20 MG TABLET    Take 3 po QD x 2d starting tomorrow, then 2 po QD x 3d then 1 po QD x 3d    Plan discharge, refer  to Dr Newell Coral, neurosurgey    MDM     I personally performed the services described in this documentation, which was scribed in my presence. The recorded information has been reviewed and considered. Devoria Albe, MD, Armando Gang         Ward Givens, MD 07/21/11 1255

## 2011-07-24 ENCOUNTER — Emergency Department (HOSPITAL_COMMUNITY)
Admission: EM | Admit: 2011-07-24 | Discharge: 2011-07-24 | Disposition: A | Discharge status: Home / Self Care | Payer: Self-pay | Attending: Emergency Medicine | Admitting: Emergency Medicine

## 2011-07-24 ENCOUNTER — Encounter (HOSPITAL_COMMUNITY): Payer: Self-pay

## 2011-07-24 DIAGNOSIS — M79609 Pain in unspecified limb: Secondary | ICD-10-CM | POA: Insufficient documentation

## 2011-07-24 DIAGNOSIS — R5381 Other malaise: Secondary | ICD-10-CM | POA: Insufficient documentation

## 2011-07-24 DIAGNOSIS — Z79899 Other long term (current) drug therapy: Secondary | ICD-10-CM | POA: Insufficient documentation

## 2011-07-24 DIAGNOSIS — R209 Unspecified disturbances of skin sensation: Secondary | ICD-10-CM | POA: Insufficient documentation

## 2011-07-24 DIAGNOSIS — M25569 Pain in unspecified knee: Secondary | ICD-10-CM | POA: Insufficient documentation

## 2011-07-24 DIAGNOSIS — M545 Low back pain, unspecified: Secondary | ICD-10-CM | POA: Insufficient documentation

## 2011-07-24 DIAGNOSIS — M549 Dorsalgia, unspecified: Secondary | ICD-10-CM | POA: Insufficient documentation

## 2011-07-24 HISTORY — DX: Dorsalgia, unspecified: M54.9

## 2011-07-24 NOTE — ED Notes (Signed)
Pt c/o lower back pain radiating into L leg x1 wk, seen here numerous times for the same, pt prescribed Valium, Dilaudid, and Prednisone 07/21/11, d/c instructions were given to f/u w/ Dr. Newell Coral for further observation, pt has not been able to get through to that office.

## 2011-07-24 NOTE — ED Provider Notes (Signed)
Medical screening examination/treatment/procedure(s) were performed by non-physician practitioner and as supervising physician I was immediately available for consultation/collaboration.   Gwyneth Sprout, MD 07/24/11 1344

## 2011-07-24 NOTE — ED Provider Notes (Signed)
History     CSN: [id number] Arrival date & time: 07/24/2011 10:17 AM   First MD Initiated Contact with Patient 07/24/11 1048      Chief Complaint  Patient presents with  . Back Pain    (Consider location/radiation/quality/duration/timing/severity/associated sxs/prior treatment) Patient is a 40 y.o. male presenting with back pain. The history is provided by the patient.  Back Pain  This is a chronic problem. The current episode started more than 1 week ago. The problem occurs constantly. The problem has been gradually worsening. The pain is present in the lumbar spine. The pain radiates to the right thigh and right knee. The pain is at a severity of 10/10. The symptoms are aggravated by bending, twisting and certain positions. Associated symptoms include numbness, leg pain, paresthesias and weakness. Pertinent negatives include no fever, no abdominal pain, no bowel incontinence, no perianal numbness and no bladder incontinence.  Pt seen for the same several times in the last week. States had MRI done which showed herniated disk. States pain is not improving, called Dr. Antony Contras office as was directed and unable to get an appointment. Pt denies any changes in pain or new injuries.   Past Medical History  Diagnosis Date  . Back pain     History reviewed. No pertinent past surgical history.  History reviewed. No pertinent family history.  History  Substance Use Topics  . Smoking status: Former Games developer  . Smokeless tobacco: Not on file  . Alcohol Use: No      Review of Systems  Constitutional: Negative for fever.  HENT: Negative.   Eyes: Negative.   Respiratory: Negative.   Cardiovascular: Negative.   Gastrointestinal: Negative for abdominal pain and bowel incontinence.  Genitourinary: Negative.  Negative for bladder incontinence.  Musculoskeletal: Positive for back pain.  Skin: Negative.   Neurological: Positive for weakness, numbness and paresthesias.    Psychiatric/Behavioral: Negative.     Allergies  Review of patient's allergies indicates no known allergies.  Home Medications   Current Outpatient Rx  Name Route Sig Dispense Refill  . CYCLOBENZAPRINE HCL 10 MG PO TABS Oral Take 10 mg by mouth 2 (two) times daily as needed. For muscle spasms     . DIAZEPAM 5 MG PO TABS Oral Take 5 mg by mouth 2 (two) times daily.      Marland Kitchen HYDROMORPHONE HCL 2 MG PO TABS Oral Take 1 tablet (2 mg total) by mouth every 4 (four) hours as needed for pain. 15 tablet 0  . IBUPROFEN 800 MG PO TABS Oral Take 800 mg by mouth every 8 (eight) hours as needed. For pain     . ICY HOT 10-30 % EX STCK Apply externally Apply 1 application topically 2 (two) times daily.      . OXYCODONE-ACETAMINOPHEN 5-325 MG PO TABS Oral Take 1-2 tablets by mouth every 4 (four) hours as needed. for PAIN     . PREDNISONE 10 MG PO TABS Oral Take 20 mg by mouth daily.        BP 114/66  Pulse 69  Temp(Src) 97.9 F (36.6 C) (Oral)  Resp 12  SpO2 99%  Physical Exam  Nursing note and vitals reviewed. Constitutional: He is oriented to person, place, and time. He appears well-developed and well-nourished. No distress.  HENT:  Head: Normocephalic and atraumatic.  Neck: Neck supple.  Musculoskeletal: Normal range of motion. He exhibits no edema.  Neurological: He is alert and oriented to person, place, and time.  Skin: Skin is warm and  dry. No rash noted.    ED Course  Procedures (including critical care time)  Pt with multiple prior visits for the same. MRI from 3 days ago showed herniated L5-S1 disk compressing S1 nerve. PT was supposed to be followed up by Dr. Jule Ser in the office. Pt states he called them multiple times but they are supposed to call back with an appointment. Pt yelling in the hallway, throwing papers and pill bottles at me, stating that we are "not doing our job" and that he is frustrated that we cannot fix his pain. Was examing him with Maxine Glenn, RN, pt raising  voice at both of [place], even stated "you are nothing, you work for me, and why are you still standing there talking to me when you could be calling in a neurosurgeon."  While throwing papers, pt was able to get off the stretcher, pick up all the papers of the floor and was able to ambulate down the hallway. Pt has no urinary incontinence/retention or loss of bowels, afebrile. Will d/c home.  I have not got a chance to examine pt properly because he was uncooperative.   MDM          Lottie Mussel, PA 07/24/11 1227

## 2011-07-25 ENCOUNTER — Encounter (HOSPITAL_COMMUNITY): Payer: Self-pay

## 2011-07-25 ENCOUNTER — Emergency Department (HOSPITAL_COMMUNITY)
Admission: EM | Admit: 2011-07-25 | Discharge: 2011-07-25 | Disposition: A | Discharge status: Home / Self Care | Payer: Self-pay | Attending: Emergency Medicine | Admitting: Emergency Medicine

## 2011-07-25 DIAGNOSIS — M545 Low back pain, unspecified: Secondary | ICD-10-CM | POA: Insufficient documentation

## 2011-07-25 DIAGNOSIS — R209 Unspecified disturbances of skin sensation: Secondary | ICD-10-CM | POA: Insufficient documentation

## 2011-07-25 DIAGNOSIS — M549 Dorsalgia, unspecified: Secondary | ICD-10-CM

## 2011-07-25 DIAGNOSIS — M5430 Sciatica, unspecified side: Secondary | ICD-10-CM | POA: Insufficient documentation

## 2011-07-25 DIAGNOSIS — M79609 Pain in unspecified limb: Secondary | ICD-10-CM | POA: Insufficient documentation

## 2011-07-25 MED ORDER — HYDROMORPHONE HCL PF 2 MG/ML IJ SOLN
2.0000 mg | Freq: Once | INTRAMUSCULAR | Status: AC
  Administered 2011-07-25: 2 mg via INTRAMUSCULAR
  Filled 2011-07-25: qty 1

## 2011-07-25 MED ORDER — OXYCODONE-ACETAMINOPHEN 5-325 MG PO TABS: 1.0000 | ORAL_TABLET | Freq: Four times a day (QID) | ORAL | Status: DC | PRN

## 2011-07-25 MED ORDER — CYCLOBENZAPRINE HCL 10 MG PO TABS: 10.0000 mg | ORAL_TABLET | Freq: Three times a day (TID) | ORAL | Status: DC | PRN

## 2011-07-25 NOTE — ED Notes (Signed)
Pt presents with L leg pain and low back pain for "2 weeks straight".  Pt reports he has had MRI, CT scan and xrays all with results faxed to his MD.  Pt reports he was supposed to be scheduled for surgery, but reports he arrived here and was told that it was postponed until Friday.

## 2011-07-25 NOTE — ED Provider Notes (Signed)
History     CSN: [id number] Arrival date & time: 07/25/2011  2:43 PM   First MD Initiated Contact with Patient 07/25/11 1709      Chief Complaint  Patient presents with  . Leg Pain    (Consider location/radiation/quality/duration/timing/severity/associated sxs/prior treatment) Patient is a 40 y.o. male presenting with leg pain and back pain. The history is provided by the patient.  Leg Pain  The incident occurred more than 1 week ago. There was no injury mechanism. The pain is present in the left leg (Lumbar back). The quality of the pain is described as aching and sharp. The pain is at a severity of 10/10. The pain is severe. The pain has been constant since onset. Associated symptoms include numbness and loss of sensation. Pertinent negatives include no loss of motion, no muscle weakness and no tingling.  Back Pain  This is a chronic problem. The current episode started more than 1 week ago. The problem occurs constantly. The problem has not changed since onset.The pain is associated with no known injury. The pain is present in the lumbar spine. The quality of the pain is described as stabbing, aching and shooting. The pain radiates to the left thigh (Left leg). The pain is at a severity of 10/10. The pain is severe. The symptoms are aggravated by certain positions. Associated symptoms include numbness, leg pain and paresthesias. Pertinent negatives include no chest pain, no fever, no headaches, no abdominal pain, no bowel incontinence, no perianal numbness, no bladder incontinence, no dysuria, no paresis, no tingling and no weakness.   Patient with history of lumbar and left leg pain and back pain at least since the end of November as was seen in the emergency department coming today 6 times, had MRI on December 1, referred to [facility] neurosurgery was to be seen by them today. They had to rearrange his schedule and he is to see them on the seventh. Patient states he is out of pain medicine  and needs some pain relief. Patient problematic in the waiting room and required police to get involved to settle him down.   Past Medical History  Diagnosis Date  . Back pain     History reviewed. No pertinent past surgical history.  No family history on file.  History  Substance Use Topics  . Smoking status: Former Games developer  . Smokeless tobacco: Not on file  . Alcohol Use: No      Review of Systems  Constitutional: Negative for fever.  HENT: Negative for neck pain.   Eyes: Negative for visual disturbance.  Respiratory: Negative for cough and shortness of breath.   Cardiovascular: Negative for chest pain.  Gastrointestinal: Negative for nausea, vomiting, abdominal pain and bowel incontinence.  Genitourinary: Negative for bladder incontinence, dysuria and hematuria.  Musculoskeletal: Positive for back pain.  Neurological: Positive for numbness and paresthesias. Negative for tingling, weakness and headaches.    Allergies  Review of patient's allergies indicates no known allergies.  Home Medications   Current Outpatient Rx  Name Route Sig Dispense Refill  . CYCLOBENZAPRINE HCL 10 MG PO TABS Oral Take 1 tablet (10 mg total) by mouth 3 (three) times daily as needed for muscle spasms. 20 tablet 0  . OXYCODONE-ACETAMINOPHEN 5-325 MG PO TABS Oral Take 1-2 tablets by mouth every 6 (six) hours as needed for pain. 14 tablet 0    BP 109/89  Pulse 67  Temp(Src) 97.8 F (36.6 C) (Oral)  Resp 18  SpO2 100%  Physical Exam  Constitutional: He is oriented to person, place, and time. He appears well-developed and well-nourished. He appears distressed.  HENT:  Head: Normocephalic and atraumatic.  Mouth/Throat: Oropharynx is clear and moist.  Eyes: Conjunctivae and EOM are normal. Pupils are equal, round, and reactive to light.  Neck: Normal range of motion. Neck supple.  Cardiovascular: Normal rate, regular rhythm and normal heart sounds.   Pulmonary/Chest: Effort normal and  breath sounds normal.  Abdominal: Soft. Bowel sounds are normal. There is no tenderness.  Musculoskeletal: Normal range of motion. He exhibits no edema and no tenderness.  Neurological: He is alert and oriented to person, place, and time. No cranial nerve deficit. He exhibits normal muscle tone. Coordination normal.  Skin: Skin is warm. No rash noted.    ED Course  Procedures (including critical care time)  Labs Reviewed - No data to display No results found.   1. Back pain   2. Sciatica       MDM   Patient with chronic back pain and left leg pain consistent with sciatica he has been seen in the emergency department 6 times since since November 26. He states that he was due for surgery by neurosurgery today but it got postponed until the seventh he is here for pain problem. Past visits reviewed, patient has had an MRI, which depicts compression of the S1 nerve root. MRI was done on December 1. Patient given IM pain medicines in the emergency department we'll supplement with additional pain medicines to carry him through until December 7 when he'll be seen by neurosurgery.  Patient has numbness to the left leg but motor is intact. No right-sided symptoms.         Shelda Jakes, MD 07/25/11 [phone]

## 2011-07-25 NOTE — ED Notes (Signed)
Pt is yelling and cursing in triage, and at times will not answer questions.  Pt keeps repeating "I'm in pain".

## 2011-07-27 ENCOUNTER — Encounter (HOSPITAL_COMMUNITY): Payer: Self-pay | Admitting: Pharmacy Technician

## 2011-07-27 ENCOUNTER — Other Ambulatory Visit: Payer: Self-pay | Admitting: Neurosurgery

## 2011-07-27 ENCOUNTER — Encounter (HOSPITAL_COMMUNITY): Payer: Self-pay | Admitting: *Deleted

## 2011-07-27 MED ORDER — CEFAZOLIN SODIUM 1-5 GM-% IV SOLN
1.0000 g | INTRAVENOUS | Status: AC
  Administered 2011-07-28: 1 g via INTRAVENOUS
  Filled 2011-07-27: qty 50

## 2011-07-28 ENCOUNTER — Ambulatory Visit (HOSPITAL_COMMUNITY): Payer: Medicaid Other | Admitting: Anesthesiology

## 2011-07-28 ENCOUNTER — Inpatient Hospital Stay (HOSPITAL_COMMUNITY)
Admission: RE | Admit: 2011-07-28 | Discharge: 2011-07-29 | DRG: 491 | Disposition: A | Discharge status: Home / Self Care | Payer: Medicaid Other | Source: Ambulatory Visit | Attending: Neurosurgery | Admitting: Neurosurgery

## 2011-07-28 ENCOUNTER — Ambulatory Visit (HOSPITAL_COMMUNITY): Payer: Medicaid Other

## 2011-07-28 ENCOUNTER — Other Ambulatory Visit: Payer: Self-pay

## 2011-07-28 ENCOUNTER — Encounter (HOSPITAL_COMMUNITY): Payer: Self-pay | Admitting: Anesthesiology

## 2011-07-28 ENCOUNTER — Encounter (HOSPITAL_COMMUNITY)
Admission: RE | Disposition: A | Discharge status: Home / Self Care | Payer: Self-pay | Source: Ambulatory Visit | Attending: Neurosurgery

## 2011-07-28 ENCOUNTER — Encounter (HOSPITAL_COMMUNITY): Payer: Self-pay | Admitting: *Deleted

## 2011-07-28 DIAGNOSIS — M51379 Other intervertebral disc degeneration, lumbosacral region without mention of lumbar back pain or lower extremity pain: Secondary | ICD-10-CM | POA: Diagnosis present

## 2011-07-28 DIAGNOSIS — M5137 Other intervertebral disc degeneration, lumbosacral region: Secondary | ICD-10-CM | POA: Diagnosis present

## 2011-07-28 DIAGNOSIS — M5126 Other intervertebral disc displacement, lumbar region: Principal | ICD-10-CM | POA: Diagnosis present

## 2011-07-28 DIAGNOSIS — M47817 Spondylosis without myelopathy or radiculopathy, lumbosacral region: Secondary | ICD-10-CM | POA: Diagnosis present

## 2011-07-28 HISTORY — PX: LUMBAR LAMINECTOMY/DECOMPRESSION MICRODISCECTOMY: SHX5026

## 2011-07-28 LAB — COMPREHENSIVE METABOLIC PANEL
ALT: 24 U/L (ref 0–53)
AST: 10 U/L (ref 0–37)
Albumin: 3.8 g/dL (ref 3.5–5.2)
Alkaline Phosphatase: 54 U/L (ref 39–117)
BUN: 18 mg/dL (ref 6–23)
CO2: 30 mEq/L (ref 19–32)
Calcium: 9.8 mg/dL (ref 8.4–10.5)
Chloride: 97 mEq/L (ref 96–112)
Creatinine, Ser: 0.84 mg/dL (ref 0.50–1.35)
GFR calc Af Amer: >90 mL/min (ref >90)
GFR calc non Af Amer: >90 mL/min (ref >90)
Glucose, Bld: 84 mg/dL (ref 70–99)
Potassium: 3.4 mEq/L — ABNORMAL LOW (ref 3.5–5.1)
Sodium: 136 mEq/L (ref 135–145)
Total Bilirubin: 0.7 mg/dL (ref 0.3–1.2)
Total Protein: 7.7 g/dL (ref 6.0–8.3)

## 2011-07-28 LAB — CBC
HCT: 45.8 % (ref 39.0–52.0)
Hemoglobin: 16.2 g/dL (ref 13.0–17.0)
MCH: 30.3 pg (ref 26.0–34.0)
MCHC: 35.4 g/dL (ref 30.0–36.0)
MCV: 85.6 fL (ref 78.0–100.0)
Platelets: 238 10*3/uL (ref 150–400)
RBC: 5.35 MIL/uL (ref 4.22–5.81)
RDW: 13.4 % (ref 11.5–15.5)
WBC: 13.5 10*3/uL — ABNORMAL HIGH (ref 4.0–10.5)

## 2011-07-28 LAB — MRSA PCR SCREENING: MRSA by PCR: NEGATIVE

## 2011-07-28 SURGERY — LUMBAR LAMINECTOMY/DECOMPRESSION MICRODISCECTOMY
Anesthesia: General | Site: Back | Laterality: Left | Wound class: Clean

## 2011-07-28 MED ORDER — MUPIROCIN 2 % EX OINT
TOPICAL_OINTMENT | CUTANEOUS | Status: AC
  Filled 2011-07-28: qty 22

## 2011-07-28 MED ORDER — SODIUM CHLORIDE 0.9 % IJ SOLN: 3.0000 mL | INTRAMUSCULAR | Status: DC | PRN

## 2011-07-28 MED ORDER — MIDAZOLAM HCL 5 MG/5ML IJ SOLN
INTRAMUSCULAR | Status: DC | PRN
  Administered 2011-07-28: 2 mg via INTRAVENOUS

## 2011-07-28 MED ORDER — HYDROXYZINE HCL 50 MG PO TABS
50.0000 mg | ORAL_TABLET | ORAL | Status: DC | PRN
  Filled 2011-07-28: qty 1

## 2011-07-28 MED ORDER — PHENOL 1.4 % MT LIQD
1.0000 | OROMUCOSAL | Status: DC | PRN
  Filled 2011-07-28: qty 177

## 2011-07-28 MED ORDER — KCL IN DEXTROSE-NACL 20-5-0.45 MEQ/L-%-% IV SOLN
INTRAVENOUS | Status: DC
  Administered 2011-07-28: 18:00:00 via INTRAVENOUS
  Filled 2011-07-28 (×5): qty 1000

## 2011-07-28 MED ORDER — LIDOCAINE-EPINEPHRINE 0.5-1:200000 % IJ SOLN
INTRAMUSCULAR | Status: DC | PRN
  Administered 2011-07-28: 11:00:00 via INTRAMUSCULAR

## 2011-07-28 MED ORDER — MENTHOL 3 MG MT LOZG: 1.0000 | LOZENGE | OROMUCOSAL | Status: DC | PRN

## 2011-07-28 MED ORDER — PROPOFOL 10 MG/ML IV EMUL
INTRAVENOUS | Status: DC | PRN
  Administered 2011-07-28: 200 mg via INTRAVENOUS
  Administered 2011-07-28: 100 mg via INTRAVENOUS

## 2011-07-28 MED ORDER — HYDROCODONE-ACETAMINOPHEN 5-325 MG PO TABS: 1.0000 | ORAL_TABLET | ORAL | Status: DC | PRN

## 2011-07-28 MED ORDER — FENTANYL CITRATE 0.05 MG/ML IJ SOLN
INTRAMUSCULAR | Status: AC
  Filled 2011-07-28: qty 2

## 2011-07-28 MED ORDER — LACTATED RINGERS IV SOLN
INTRAVENOUS | Status: DC | PRN
  Administered 2011-07-28 (×2): via INTRAVENOUS

## 2011-07-28 MED ORDER — ACETAMINOPHEN 650 MG RE SUPP: 650.0000 mg | RECTAL | Status: DC | PRN

## 2011-07-28 MED ORDER — KETOROLAC TROMETHAMINE 30 MG/ML IJ SOLN
30.0000 mg | Freq: Once | INTRAMUSCULAR | Status: AC
  Administered 2011-07-28: 30 mg via INTRAVENOUS

## 2011-07-28 MED ORDER — ALUM & MAG HYDROXIDE-SIMETH 400-400-40 MG/5ML PO SUSP
30.0000 mL | Freq: Four times a day (QID) | ORAL | Status: DC | PRN
  Filled 2011-07-28 (×2): qty 30

## 2011-07-28 MED ORDER — KETOROLAC TROMETHAMINE 30 MG/ML IJ SOLN
30.0000 mg | Freq: Four times a day (QID) | INTRAMUSCULAR | Status: DC
  Administered 2011-07-28 – 2011-07-29 (×3): 30 mg via INTRAVENOUS
  Filled 2011-07-28 (×7): qty 1

## 2011-07-28 MED ORDER — THROMBIN 5000 UNITS EX KIT
PACK | CUTANEOUS | Status: DC | PRN
  Administered 2011-07-28: 10000 [IU] via TOPICAL

## 2011-07-28 MED ORDER — ZOLPIDEM TARTRATE 10 MG PO TABS
10.0000 mg | ORAL_TABLET | Freq: Every evening | ORAL | Status: DC | PRN
  Administered 2011-07-28: 10 mg via ORAL
  Filled 2011-07-28: qty 1

## 2011-07-28 MED ORDER — SODIUM CHLORIDE 0.9 % IR SOLN
Status: DC | PRN
  Administered 2011-07-28: 11:00:00

## 2011-07-28 MED ORDER — HYDROXYZINE HCL 50 MG/ML IM SOLN
50.0000 mg | INTRAMUSCULAR | Status: DC | PRN
  Filled 2011-07-28: qty 1

## 2011-07-28 MED ORDER — LIDOCAINE-EPINEPHRINE 1 %-1:100000 IJ SOLN
INTRAMUSCULAR | Status: DC | PRN
  Administered 2011-07-28: 10 mL

## 2011-07-28 MED ORDER — OXYCODONE-ACETAMINOPHEN 5-325 MG PO TABS
1.0000 | ORAL_TABLET | ORAL | Status: DC | PRN
  Administered 2011-07-28: 1 via ORAL
  Filled 2011-07-28: qty 1

## 2011-07-28 MED ORDER — MORPHINE SULFATE 4 MG/ML IJ SOLN: 4.0000 mg | INTRAMUSCULAR | Status: DC | PRN

## 2011-07-28 MED ORDER — CYCLOBENZAPRINE HCL 10 MG PO TABS: 10.0000 mg | ORAL_TABLET | Freq: Three times a day (TID) | ORAL | Status: DC | PRN

## 2011-07-28 MED ORDER — FENTANYL CITRATE 0.05 MG/ML IJ SOLN
INTRAMUSCULAR | Status: DC | PRN
  Administered 2011-07-28 (×5): 100 ug via INTRAVENOUS

## 2011-07-28 MED ORDER — METHYLPREDNISOLONE ACETATE PF 80 MG/ML IJ SUSP
INTRAMUSCULAR | Status: DC | PRN
  Administered 2011-07-28: 80 mg via INTRA_ARTICULAR

## 2011-07-28 MED ORDER — GLYCOPYRROLATE 0.2 MG/ML IJ SOLN
INTRAMUSCULAR | Status: DC | PRN
  Administered 2011-07-28: .7 mg via INTRAVENOUS

## 2011-07-28 MED ORDER — HEMOSTATIC AGENTS (NO CHARGE) OPTIME
TOPICAL | Status: DC | PRN
  Administered 2011-07-28: 1 via TOPICAL

## 2011-07-28 MED ORDER — ROCURONIUM BROMIDE 100 MG/10ML IV SOLN
INTRAVENOUS | Status: DC | PRN
  Administered 2011-07-28: 20 mg via INTRAVENOUS
  Administered 2011-07-28: 50 mg via INTRAVENOUS

## 2011-07-28 MED ORDER — ONDANSETRON HCL 4 MG/2ML IJ SOLN
INTRAMUSCULAR | Status: DC | PRN
  Administered 2011-07-28: 4 mg via INTRAVENOUS

## 2011-07-28 MED ORDER — HYDROMORPHONE HCL PF 1 MG/ML IJ SOLN: 0.2500 mg | INTRAMUSCULAR | Status: DC | PRN

## 2011-07-28 MED ORDER — ACETAMINOPHEN 325 MG PO TABS: 650.0000 mg | ORAL_TABLET | ORAL | Status: DC | PRN

## 2011-07-28 MED ORDER — SODIUM CHLORIDE 0.9 % IJ SOLN: 3.0000 mL | Freq: Two times a day (BID) | INTRAMUSCULAR | Status: DC

## 2011-07-28 MED ORDER — NEOSTIGMINE METHYLSULFATE 1 MG/ML IJ SOLN
INTRAMUSCULAR | Status: DC | PRN
  Administered 2011-07-28: 5 mg via INTRAVENOUS

## 2011-07-28 MED ORDER — FENTANYL CITRATE 0.05 MG/ML IJ SOLN
INTRAMUSCULAR | Status: DC | PRN
  Administered 2011-07-28: 100 ug via INTRAVENOUS

## 2011-07-28 MED ORDER — SODIUM CHLORIDE 0.9 % IV SOLN: 250.0000 mL | INTRAVENOUS | Status: DC

## 2011-07-28 MED ORDER — ONDANSETRON HCL 4 MG/2ML IJ SOLN: 4.0000 mg | Freq: Four times a day (QID) | INTRAMUSCULAR | Status: DC | PRN

## 2011-07-28 SURGICAL SUPPLY — 54 items
ADH SKN CLS APL DERMABOND .7 (GAUZE/BANDAGES/DRESSINGS) ×2
BAG DECANTER FOR FLEXI CONT (MISCELLANEOUS) ×1 IMPLANT
BLADE SURG ROTATE 9660 (MISCELLANEOUS) IMPLANT
BRUSH SCRUB EZ PLAIN DRY (MISCELLANEOUS) ×1 IMPLANT
BUR ACORN 6.0 ACORN (BURR) ×1 IMPLANT
BUR ACRON 5.0MM COATED (BURR) IMPLANT
BUR MATCHSTICK NEURO 3.0 LAGG (BURR) ×1 IMPLANT
CANISTER SUCTION 2500CC (MISCELLANEOUS) ×1 IMPLANT
CLOTH BEACON ORANGE TIMEOUT ST (SAFETY) ×1 IMPLANT
CONT SPEC 4OZ CLIKSEAL STRL BL (MISCELLANEOUS) ×1 IMPLANT
DERMABOND ADVANCED (GAUZE/BANDAGES/DRESSINGS) ×2
DERMABOND ADVANCED .7 DNX12 (GAUZE/BANDAGES/DRESSINGS) IMPLANT
DRAPE LAPAROTOMY 100X72X124 (DRAPES) ×1 IMPLANT
DRAPE MICROSCOPE LEICA (MISCELLANEOUS) ×1 IMPLANT
DRAPE POUCH INSTRU U-SHP 10X18 (DRAPES) ×1 IMPLANT
ELECT REM PT RETURN 9FT ADLT (ELECTROSURGICAL) ×2
ELECTRODE REM PT RTRN 9FT ADLT (ELECTROSURGICAL) IMPLANT
GAUZE SPONGE 4X4 16PLY XRAY LF (GAUZE/BANDAGES/DRESSINGS) IMPLANT
GLOVE BIO SURGEON STRL SZ 6.5 (GLOVE) ×4 IMPLANT
GLOVE BIOGEL PI IND STRL 8 (GLOVE) IMPLANT
GLOVE BIOGEL PI INDICATOR 8 (GLOVE) ×1
GLOVE ECLIPSE 7.5 STRL STRAW (GLOVE) ×3 IMPLANT
GLOVE EXAM NITRILE LRG STRL (GLOVE) ×1 IMPLANT
GLOVE EXAM NITRILE MD LF STRL (GLOVE) ×1 IMPLANT
GLOVE SURG SS PI 6.5 STRL IVOR (GLOVE) ×1 IMPLANT
GOWN BRE IMP SLV AUR LG STRL (GOWN DISPOSABLE) ×3 IMPLANT
GOWN BRE IMP SLV AUR XL STRL (GOWN DISPOSABLE) IMPLANT
KIT BASIN OR (CUSTOM PROCEDURE TRAY) ×1 IMPLANT
KIT ROOM TURNOVER OR (KITS) ×1 IMPLANT
NDL HYPO 18GX1.5 BLUNT FILL (NEEDLE) IMPLANT
NDL SPNL 18GX3.5 QUINCKE PK (NEEDLE) IMPLANT
NDL SPNL 22GX3.5 QUINCKE BK (NEEDLE) IMPLANT
NEEDLE HYPO 18GX1.5 BLUNT FILL (NEEDLE) ×2 IMPLANT
NEEDLE HYPO 22GX1.5 SAFETY (NEEDLE) ×1 IMPLANT
NEEDLE SPNL 18GX3.5 QUINCKE PK (NEEDLE) ×2 IMPLANT
NEEDLE SPNL 22GX3.5 QUINCKE BK (NEEDLE) ×2 IMPLANT
NS IRRIG 1000ML POUR BTL (IV SOLUTION) ×1 IMPLANT
PACK LAMINECTOMY NEURO (CUSTOM PROCEDURE TRAY) ×1 IMPLANT
PAD ARMBOARD 7.5X6 YLW CONV (MISCELLANEOUS) ×3 IMPLANT
PATTIES SURGICAL .5 X1 (DISPOSABLE) ×1 IMPLANT
RUBBERBAND STERILE (MISCELLANEOUS) ×2 IMPLANT
SPONGE GAUZE 4X4 12PLY (GAUZE/BANDAGES/DRESSINGS) IMPLANT
SPONGE LAP 4X18 X RAY DECT (DISPOSABLE) IMPLANT
SPONGE SURGIFOAM ABS GEL SZ50 (HEMOSTASIS) ×1 IMPLANT
SUT PROLENE 6 0 BV (SUTURE) ×1 IMPLANT
SUT VIC AB 1 CT1 18XBRD ANBCTR (SUTURE) IMPLANT
SUT VIC AB 1 CT1 8-18 (SUTURE) ×2
SUT VIC AB 2-0 CP2 18 (SUTURE) ×1 IMPLANT
SUT VIC AB 3-0 SH 8-18 (SUTURE) ×1 IMPLANT
SYR 20CC LL (SYRINGE) ×1 IMPLANT
SYR 5ML LL (SYRINGE) ×1 IMPLANT
TOWEL OR 17X24 6PK STRL BLUE (TOWEL DISPOSABLE) ×1 IMPLANT
TOWEL OR 17X26 10 PK STRL BLUE (TOWEL DISPOSABLE) ×1 IMPLANT
WATER STERILE IRR 1000ML POUR (IV SOLUTION) ×1 IMPLANT

## 2011-07-28 NOTE — Progress Notes (Signed)
07/28/2011  12:20 PM  PATIENT:  Bryan Ortega  40 y.o. male  PRE-OPERATIVE DIAGNOSIS:  lumbar five-sacral one herniated disc, degenerative disc disease, spondylosis, radiculopathy  POST-OPERATIVE DIAGNOSIS:  lumbar five-sacral one herniated disc, degenerative disc disease, spondylosis, radiculopathy  PROCEDURE:  Procedure(s): LUMBAR LAMINECTOMY/DECOMPRESSION MICRODISCECTOMY  SURGEON:  Surgeon(s): Baldwin Crown Kritzer  ASSISTANTS: Rolanda Lundborg Kritzer  ANESTHESIA:   general  EBL:  Total I/O In: 1200 [I.V.:1200] Out: 25 [Blood:25]  BLOOD ADMINISTERED:none  CELL SAVER GIVEN: None  COUNT: Correct per nursing staff  DRAINS: none   SPECIMEN:  No Specimen  DICTATION: Patient was brought to the operating room and placed under general endotracheal anesthesia. Patient was turned to prone position the lumbar region was prepped with Betadine soap and solution and draped in a sterile fashion. The midline was infiltrated with local anesthetic with epinephrine. A localizing x-ray was taken and the L5-S1 level was identified. Midline incision was made over the L5-S1 level and was carried down through the subcutaneous tissue to the lumbar fascia. The lumbar fascia was incised on the left side and the paraspinal muscles were dissected from the spinous processes and lamina in a subperiosteal fashion. Another x-ray was taken, the surgical instrument was at the L4-5 interlaminar space, and the L5-S1 intralaminar space was then identified. Laminotomy was performed using the high-speed drill and Kerrison punches. The ligamentum flavum was carefully resected. The underlying thecal sac and nerve root were identified. The disc herniation was identified and the thecal sac and nerve root gently retracted medially. We immediately identified a herniated fragment, protruding through the remaining annular fibers, this was removed in a piecemeal fashion using pituitary rongeurs. We then further open the  annulus with a #15 scalpel. We entered the disc space and continued a thorough discectomy using a variety of pituitary rongeurs and micro-curettes. A thorough discectomy was performed, removing all loose fragments of disc material from the disc space and epidural space. Once the discectomy was completed and good decompression of the thecal sac and nerve had been achieved hemostasis was established with the use of bipolar cautery and Gelfoam with thrombin. The Gelfoam was removed and hemostasis confirmed. We then instilled 2 cc of fentanyl and 80 mg of Depo-Medrol into the epidural space. Deep fascia was closed with interrupted undyed 1 Vicryl sutures. Scarpa's fascia was closed with interrupted undyed 1 Vicryl sutures in the subcutaneous and subcuticular layer were closed with interrupted inverted 2-0 undyed Vicryl sutures. The skin edges were approximated with Dermabond. Following surgery the patient was turned back to a supine position to be reversed from the anesthetic extubated and transferred to the recovery room for further care.  PLAN OF CARE: Admit to inpatient   PATIENT DISPOSITION:  PACU - hemodynamically stable.   Delay start of Pharmacological VTE agent (>24hrs) due to surgical blood loss or risk of bleeding:  yes

## 2011-07-28 NOTE — Anesthesia Preprocedure Evaluation (Addendum)
Anesthesia Evaluation  Patient identified by MRN, date of birth, ID band Patient awake    Reviewed: Allergy & Precautions, H&P , NPO status , Patient's Chart, lab work & pertinent test results, reviewed documented beta blocker date and time   Airway Mallampati: II TM Distance: >3 FB Neck ROM: full    Dental  (+) Teeth Intact   Pulmonary          Cardiovascular     Neuro/Psych    GI/Hepatic   Endo/Other    Renal/GU      Musculoskeletal   Abdominal   Peds  Hematology   Anesthesia Other Findings Gap between front two upper teeth.  No general anesthesia in past.  Reproductive/Obstetrics                         Anesthesia Physical Anesthesia Plan  ASA: II  Anesthesia Plan: General   Post-op Pain Management:    Induction: Intravenous  Airway Management Planned: Oral ETT  Additional Equipment:   Intra-op Plan:   Post-operative Plan:   Informed Consent: I have reviewed the patients History and Physical, chart, labs and discussed the procedure including the risks, benefits and alternatives for the proposed anesthesia with the patient or authorized representative who has indicated his/her understanding and acceptance.     Plan Discussed with: CRNA and Surgeon  Anesthesia Plan Comments:         Anesthesia Quick Evaluation

## 2011-07-28 NOTE — H&P (Signed)
Subjective: Patient is a 40 y.o. male who is admitted for treatment of disabling left lumbar radiculopathy. Symptoms began to 3 weeks ago and steadily worsened with disabling pain from the left buttock to the left posterior thigh and calf. He cannot stand upright because of the severity the pain and comes to the office in a wheelchair. He denies midline low back pain. He's been treated with end-stage Dilaudid Valium and prednisone Dosepak without relief. MRI was done and reveals a large left L5-S1 lumbar disc patient is admitted now for a lumbar discectomy..    Patient Active Problem List  Diagnoses Date Noted  . Back pain    Past Medical History  Diagnosis Date  . Back pain     History reviewed. No pertinent past surgical history.  Prescriptions prior to admission  Medication Sig Dispense Refill  . cyclobenzaprine (FLEXERIL) 10 MG tablet Take 1 tablet (10 mg total) by mouth 3 (three) times daily as needed for muscle spasms.  20 tablet  0  . oxyCODONE-acetaminophen (PERCOCET) 5-325 MG per tablet Take 1-2 tablets by mouth every 6 (six) hours as needed for pain.  14 tablet  0   No Known Allergies  History  Substance Use Topics  . Smoking status: Former Games developer  . Smokeless tobacco: Not on file  . Alcohol Use: No    History reviewed. No pertinent family history.   Review of Systems A comprehensive review of systems was negative.  Objective: Vital signs in last 24 hours: Temp:  [97.9 F (36.6 C)] 97.9 F (36.6 C) (12/08 0713) Pulse Rate:  [82] 82  (12/08 0713) Resp:  [18] 18  (12/08 0713) BP: (109)/(72) 109/72 mmHg (12/08 0713) SpO2:  [100 %] 100 % (12/08 0713) Weight:  [70.308 kg (155 lb)] 155 lb (70.308 kg) (12/08 0713)  EXAM: Thin black male in obvious pain and discomfort lungs are clear to auscultation he has symmetrical respiratory excursion. Heart is regular rate and rhythm normal S1 and S2 there is no murmur abdomen is soft nontender nondistended bowel sounds are present.  Extremity examination shows no clubbing cyanosis or edema. Musculoskeletal examination shows no tenderness to palpation over the lumbar spinous processes or paralumbar musculature. He is unable to flex or extend from his flexed forward posture of about 20 straight leg raising is positive the left at 20-30 straight leg raising is negative on the right.  Neurologic examination shows 5 over 5 strength in the lower extremities including the iliopsoas quadriceps dorsiflexor centralis longus and plantar flexor bilaterally sensation is intact to pinprick in the distal lower from his reflexes are 2 in the quadriceps and gastrocnemius. Toes are downgoing. His gait and stance are abnormal with his stance is showing and having to stand with his left lower extremity flexed and his back flexed forward in a seemingly standing fetal position he is unable to ambulate any significant distance secondary to disabling pain.  Data Review:CBC    Component Value Date/Time   WBC 10.9* 07/21/2011 0838   RBC 4.43 07/21/2011 0838   HGB 13.6 07/21/2011 0838   HCT 38.5* 07/21/2011 0838   PLT 173 07/21/2011 0838   MCV 86.9 07/21/2011 0838   MCH 30.7 07/21/2011 0838   MCHC 35.3 07/21/2011 0838   RDW 13.0 07/21/2011 0838   LYMPHSABS 2.3 07/21/2011 0838   MONOABS 0.9 07/21/2011 0838   EOSABS 0.0 07/21/2011 0838   BASOSABS 0.0 07/21/2011 1610  BMET    Component Value Date/Time   NA 140 07/21/2011 0838   K 3.3* 07/21/2011 0838   CL 105 07/21/2011 0838   CO2 28 07/21/2011 0838   GLUCOSE 93 07/21/2011 0838   BUN 12 07/21/2011 0838   CREATININE 0.91 07/21/2011 0838   CALCIUM 9.3 07/21/2011 0838   GFRNONAA >90 07/21/2011 0838   GFRAA >90 07/21/2011 7829     Assessment/Plan: Patient admitted for a left L5-S1 lumbar laminotomy and microdiscectomy for a large left L5-S1 lumbar disc herniation with disabling left lumbar radicular pain. I've discussed with the patient the nature of his condition, the nature the  surgical procedure, the typical length of surgery, hospital stay, and overall recuperation. We discussed limitations postoperatively. I discussed risks of surgery including risks of infection, bleeding, possibly need for transfusion, the risk of nerve root dysfunction with pain, weakness, numbness, or paresthesias, or risk of dural tear and CSF leakage and possible need for further surgery, the risk of recurrent disc herniation and the possible need for further surgery, and the risk of anesthetic complications including myocardial infarction, stroke, pneumonia, and death. Understanding all this the patient does wish to proceed with surgery and is admitted for such.    Hewitt Shorts, MD 07/28/2011 7:40 AM

## 2011-07-28 NOTE — Transfer of Care (Signed)
Immediate Anesthesia Transfer of Care Note  Patient: Bryan Ortega  Procedure(s) Performed:  LUMBAR LAMINECTOMY/DECOMPRESSION MICRODISCECTOMY - Left lumbar five sacral one laminectomy and microdiskectomy  Patient Location: PACU  Anesthesia Type: General  Level of Consciousness: awake, alert  and oriented  Airway & Oxygen Therapy: Patient Spontanous Breathing and Patient connected to nasal cannula oxygen  Post-op Assessment: Report given to PACU RN and Post -op Vital signs reviewed and stable  Post vital signs: Reviewed and stable  Complications: No apparent anesthesia complications

## 2011-07-28 NOTE — Progress Notes (Signed)
Filed Vitals:   07/28/11 0713  BP: 109/72  Pulse: 82  Temp: 97.9 F (36.6 C)  Resp: 18  Height: 6\' 1"  (1.854 m)  Weight: 70.308 kg (155 lb)  SpO2: 100%    CBC  Basename 07/28/11 0722  WBC 13.5*  HGB 16.2  HCT 45.8  PLT 238   BMET  Basename 07/28/11 0722  NA 136  K 3.4*  CL 97  CO2 30  GLUCOSE 84  BUN 18  CREATININE 0.84  CALCIUM 9.8    Patient seen in recovery room awake and alert following commands moving all 4 extremities well. Incision is clean and dry. Strength is 5 over 5 in the dorsiflexor and plantar flexor bilateral. Patient has not yet voided.   Plan: After patient is transferred to floor bed, he's been instructed to ambulate in the halls at least a couple times this afternoon another couple times this evening.

## 2011-07-28 NOTE — Anesthesia Postprocedure Evaluation (Signed)
Anesthesia Post Note  Patient: Bryan Ortega  Procedure(s) Performed:  LUMBAR LAMINECTOMY/DECOMPRESSION MICRODISCECTOMY - Left lumbar five sacral one laminectomy and microdiskectomy  Anesthesia type: General  Patient location: PACU  Post pain: Pain level controlled and Adequate analgesia  Post assessment: Post-op Vital signs reviewed, Patient's Cardiovascular Status Stable, Respiratory Function Stable, Patent Airway and Pain level controlled  Last Vitals:  Filed Vitals:   07/28/11 1230  BP: 115/63  Pulse: 67  Temp: 36.4 C  Resp:     Post vital signs: Reviewed and stable  Level of consciousness: awake, alert  and oriented  Complications: No apparent anesthesia complications

## 2011-07-28 NOTE — Op Note (Signed)
07/28/2011  12:20 PM  PATIENT:  Bryan Ortega  40 y.o. male  PRE-OPERATIVE DIAGNOSIS:  lumbar five-sacral one herniated disc, degenerative disc disease, spondylosis, radiculopathy  POST-OPERATIVE DIAGNOSIS:  lumbar five-sacral one herniated disc, degenerative disc disease, spondylosis, radiculopathy  PROCEDURE:  Procedure(s): LUMBAR LAMINECTOMY/DECOMPRESSION MICRODISCECTOMY  SURGEON:  Surgeon(s): Robert W Nudelman Randy O Kritzer  ASSISTANTS: Randy O Kritzer  ANESTHESIA:   general  EBL:  Total I/O In: 1200 [I.V.:1200] Out: 25 [Blood:25]  BLOOD ADMINISTERED:none  CELL SAVER GIVEN: None  COUNT: Correct per nursing staff  DRAINS: none   SPECIMEN:  No Specimen  DICTATION: Patient was brought to the operating room and placed under general endotracheal anesthesia. Patient was turned to prone position the lumbar region was prepped with Betadine soap and solution and draped in a sterile fashion. The midline was infiltrated with local anesthetic with epinephrine. A localizing x-ray was taken and the L5-S1 level was identified. Midline incision was made over the L5-S1 level and was carried down through the subcutaneous tissue to the lumbar fascia. The lumbar fascia was incised on the left side and the paraspinal muscles were dissected from the spinous processes and lamina in a subperiosteal fashion. Another x-ray was taken, the surgical instrument was at the L4-5 interlaminar space, and the L5-S1 intralaminar space was then identified. Laminotomy was performed using the high-speed drill and Kerrison punches. The ligamentum flavum was carefully resected. The underlying thecal sac and nerve root were identified. The disc herniation was identified and the thecal sac and nerve root gently retracted medially. We immediately identified a herniated fragment, protruding through the remaining annular fibers, this was removed in a piecemeal fashion using pituitary rongeurs. We then further open the  annulus with a #15 scalpel. We entered the disc space and continued a thorough discectomy using a variety of pituitary rongeurs and micro-curettes. A thorough discectomy was performed, removing all loose fragments of disc material from the disc space and epidural space. Once the discectomy was completed and good decompression of the thecal sac and nerve had been achieved hemostasis was established with the use of bipolar cautery and Gelfoam with thrombin. The Gelfoam was removed and hemostasis confirmed. We then instilled 2 cc of fentanyl and 80 mg of Depo-Medrol into the epidural space. Deep fascia was closed with interrupted undyed 1 Vicryl sutures. Scarpa's fascia was closed with interrupted undyed 1 Vicryl sutures in the subcutaneous and subcuticular layer were closed with interrupted inverted 2-0 undyed Vicryl sutures. The skin edges were approximated with Dermabond. Following surgery the patient was turned back to a supine position to be reversed from the anesthetic extubated and transferred to the recovery room for further care.  PLAN OF CARE: Admit to inpatient   PATIENT DISPOSITION:  PACU - hemodynamically stable.   Delay start of Pharmacological VTE agent (>24hrs) due to surgical blood loss or risk of bleeding:  yes     

## 2011-07-28 NOTE — Preoperative (Signed)
Beta Blockers   Reason not to administer Beta Blockers:Not Applicable 

## 2011-07-29 MED ORDER — ALUM & MAG HYDROXIDE-SIMETH 200-200-20 MG/5ML PO SUSP: 30.0000 mL | Freq: Four times a day (QID) | ORAL | Status: DC | PRN

## 2011-07-29 MED ORDER — OXYCODONE-ACETAMINOPHEN 5-325 MG PO TABS: 1.0000 | ORAL_TABLET | ORAL | Status: AC | PRN

## 2011-07-29 NOTE — Discharge Summary (Signed)
Physician Discharge Summary  Patient ID: Hatim Homann MRN: [id number] DOB/AGE: Jun 08, 1971 40 y.o.  Admit date: 07/28/2011 Discharge date: 07/29/2011  Admission Diagnoses: Left L5-S1 lumbar disc herniation, lumbar degenerative disc disease, spondylosis, lumbar radiculopathy  Discharge Diagnoses: Left L5-S1 lumbar disc herniation, lumbar degenerative disc disease, spondylosis, lumbar radiculopathy Active Problems:  * No active hospital problems. *    Discharged Condition: good  Hospital Course: Patient was admitted and underwent a left L5-S1 lumbar laminotomy and microdiscectomy. These done very well following surgery with complete relief of his disabling radicular pain. She's been up and living in the halls. His wound is clean and dry.  Consults: none  Significant Diagnostic Studies: None  Treatments: Surgery: Left L5-S1 lumbar laminotomy and microdiscectomy  Discharge Exam: Blood pressure 113/63, pulse 56, temperature 97.8 F (36.6 C), temperature source Oral, resp. rate 18, height 6\' 1"  (1.854 m), weight 70.308 kg (155 lb), SpO2 99.00%. Wound is clean and dry. Good strength.  Disposition: Home. The patient has been given instructions regarding wound care and activities. He is to followup with me in the office in 3 weeks.   Current Discharge Medication List    CONTINUE these medications which have CHANGED   Details  oxyCODONE-acetaminophen (PERCOCET) 5-325 MG per tablet Take 1-2 tablets by mouth every 4 (four) hours as needed for pain. Qty: 50 tablet, Refills: 0      STOP taking these medications     cyclobenzaprine (FLEXERIL) 10 MG tablet          Signed: Hewitt Shorts, MD 07/29/2011, 8:26 AM

## 2011-07-30 ENCOUNTER — Encounter (HOSPITAL_COMMUNITY): Payer: Self-pay | Admitting: Neurosurgery

## 2012-02-05 ENCOUNTER — Encounter (HOSPITAL_COMMUNITY): Payer: Self-pay

## 2012-02-05 ENCOUNTER — Emergency Department (HOSPITAL_COMMUNITY)
Admission: EM | Admit: 2012-02-05 | Discharge: 2012-02-05 | Disposition: A | Discharge status: Home / Self Care | Payer: Self-pay | Attending: Emergency Medicine | Admitting: Emergency Medicine

## 2012-02-05 ENCOUNTER — Emergency Department (HOSPITAL_COMMUNITY): Payer: Self-pay

## 2012-02-05 DIAGNOSIS — R071 Chest pain on breathing: Secondary | ICD-10-CM | POA: Insufficient documentation

## 2012-02-05 DIAGNOSIS — R0789 Other chest pain: Secondary | ICD-10-CM

## 2012-02-05 DIAGNOSIS — R109 Unspecified abdominal pain: Secondary | ICD-10-CM | POA: Insufficient documentation

## 2012-02-05 DIAGNOSIS — R52 Pain, unspecified: Secondary | ICD-10-CM | POA: Insufficient documentation

## 2012-02-05 DIAGNOSIS — R079 Chest pain, unspecified: Secondary | ICD-10-CM | POA: Insufficient documentation

## 2012-02-05 LAB — CBC
MCH: 30.1 pg (ref 26.0–34.0)
Platelets: 197 10*3/uL (ref 150–400)
RBC: 4.88 MIL/uL (ref 4.22–5.81)
WBC: 5.7 10*3/uL (ref 4.0–10.5)

## 2012-02-05 LAB — DIFFERENTIAL
Eosinophils Absolute: 0.1 10*3/uL (ref 0.0–0.7)
Lymphs Abs: 1.1 10*3/uL (ref 0.7–4.0)
Neutro Abs: 4.2 10*3/uL (ref 1.7–7.7)
Neutrophils Relative %: 74 % (ref 43–77)

## 2012-02-05 LAB — COMPREHENSIVE METABOLIC PANEL
ALT: 16 U/L (ref 0–53)
Albumin: 4.4 g/dL (ref 3.5–5.2)
Alkaline Phosphatase: 51 U/L (ref 39–117)
GFR calc Af Amer: >90 mL/min (ref >90)
Glucose, Bld: 88 mg/dL (ref 70–99)
Potassium: 4 mEq/L (ref 3.5–5.1)
Sodium: 138 mEq/L (ref 135–145)
Total Protein: 8.2 g/dL (ref 6.0–8.3)

## 2012-02-05 LAB — URINALYSIS, ROUTINE W REFLEX MICROSCOPIC
Bilirubin Urine: NEGATIVE
Glucose, UA: NEGATIVE mg/dL
Ketones, ur: NEGATIVE mg/dL
pH: 7.5 (ref 5.0–8.0)

## 2012-02-05 MED ORDER — HYDROCODONE-ACETAMINOPHEN 5-325 MG PO TABS: 1.0000 | ORAL_TABLET | ORAL | Status: AC | PRN

## 2012-02-05 MED ORDER — KETOROLAC TROMETHAMINE 30 MG/ML IJ SOLN
30.0000 mg | Freq: Once | INTRAMUSCULAR | Status: AC
  Administered 2012-02-05: 30 mg via INTRAVENOUS
  Filled 2012-02-05: qty 1

## 2012-02-05 MED ORDER — CYCLOBENZAPRINE HCL 5 MG PO TABS: 5.0000 mg | ORAL_TABLET | Freq: Three times a day (TID) | ORAL | Status: AC | PRN

## 2012-02-05 MED ORDER — ONDANSETRON HCL 4 MG/2ML IJ SOLN
4.0000 mg | Freq: Once | INTRAMUSCULAR | Status: AC
  Administered 2012-02-05: 4 mg via INTRAVENOUS
  Filled 2012-02-05: qty 2

## 2012-02-05 MED ORDER — SODIUM CHLORIDE 0.9 % IV SOLN
Freq: Once | INTRAVENOUS | Status: AC
  Administered 2012-02-05: 500 mL via INTRAVENOUS

## 2012-02-05 MED ORDER — MORPHINE SULFATE 4 MG/ML IJ SOLN
4.0000 mg | Freq: Once | INTRAMUSCULAR | Status: AC
  Administered 2012-02-05: 4 mg via INTRAVENOUS
  Filled 2012-02-05: qty 1

## 2012-02-05 NOTE — ED Notes (Signed)
Discharge instructions reviewed with pt, questions answered. Pt verbalized understanding.  

## 2012-02-05 NOTE — ED Notes (Signed)
Pt is consistently bradycardiac, Burgess Amor PA made aware.

## 2012-02-05 NOTE — Discharge Instructions (Signed)
Your tests today are negative for any obvious source of your pain symptoms.  I suspect that this may be muscle spasm since it worsens with movement.  You have been prescribed several medicines including a pain reliever which will make you drowsy so use caution in do not drive within 4 hours of taking this.  I'm also prescribing you a muscle relaxer which may also help relieve your symptoms.  Please get rechecked either here or with another provider if your symptoms return or worsen in any way.   RESOURCE GUIDE  Chronic Pain Problems: Contact [facility]  [phone] Patients need to be referred by their primary care doctor.  Insufficient Money for Medicine: Contact United Way:  call "211" or [facility] [phone].  No Primary Care Doctor: - Call [facility]  [phone] - can help you locate a primary care doctor that  accepts your insurance, provides certain services, etc. - Physician Referral Service- [phone]  Agencies that provide inexpensive medical care: - [facility]  [phone] - [facility]  [phone] - [facility]  [phone] - Women's Clinic  [phone] - Planned Parenthood  [phone] [facility]  [phone]  Medicaid-accepting [facility] Providers: - [facility]- [address]  [phone], Mon-Fri 9am-7pm, Sat 9am-1pm - [facility]- [address] [place], Suite [place]  [phone] - [facility]- [address] [place]  [phone] [facility]- [address]  [phone] - Renaye Rakers- [address] [place], [address], [phone]  Only accepts [place] patients after they have their name  applied to their card  Self Pay (no insurance) in [place]: - Sickle Cell Patients: Dr Willey Blade, [facility]  [address] [place], [phone] - [facility]-  [address] [place]  [phone]       [facility]- [address]       -     [facility]- see information above (Speak to [employer] if you do not have insurance)       -  [facility]- [address] [place], [phone]       -  [facility]- [address],  [phone]       -  [facility]- [address] [phone]       -  Dr Julio Sicks-  [address], [place], [phone]       -  [facility]- [address], [phone]       -  [facility]- [address], [phone], also [address] [phone]       -    [facility]- [address] [place], [phone], 1st & 3rd Saturday   every month, 10am-1pm  1) Find a Doctor and Pay Out of Pocket Although you won't have to find out who is covered by your insurance plan, it is a good idea to ask around and get recommendations. You will then need to call the office and see if the doctor you have chosen will accept you as a new patient and what types of options they offer for patients who are self-pay. Some doctors offer discounts or will set up payment plans for their patients  who do not have insurance, but you will need to ask so you aren't surprised when you get to your appointment.  2) Contact Your Local Health Department Not all health departments have doctors that can see patients for sick visits, but many do, so it is worth a call to see if yours does. If you don't know where your local health department is, you can check in your phone book. The CDC also has a tool to help you locate your state's health department, and many state websites also have listings of all of their local health departments.  3) Find a Walk-in Clinic If your illness is not likely to be very severe or complicated, you may want to try a walk in clinic. These are popping up all over the country in pharmacies, drugstores, and shopping centers. They're usually  staffed by nurse practitioners or physician assistants that have been trained to treat common illnesses and complaints. They're usually fairly quick and inexpensive. However, if you have serious medical issues or chronic medical problems, these are probably not your best option  STD Testing - [facility] Department of [facility], STD Clinic, [address], [place], phone [phone] or [phone].  Monday - Friday, call for an appointment. [facility] Department of [employer], STD Clinic, [address], [place], phone [phone] or [phone].  Monday - Friday, call for an appointment.  Abuse/Neglect: [facility] Child Abuse Hotline 667-107-9831 [facility] Child Abuse Hotline (845) 080-7790 (After Hours)  Emergency Shelter:  [facility] (562)[phone]  Maternity Homes: - [facility] (201) 638-2608 - [facility] [phone]  MRSA Hotline #:   660 861 0040  [facility] Resources  [facility] [place]  United Way [facility] Dept. [address]                 [address]         [address] [place] [address]  Blondell Reveal Phone:  [phone]                                  Phone:  [phone]                   Phone:  [phone]  [facility], [phone] - [facility] - [phone]       -     [facility] in [place], [address],                                  [phone], Insurance  [facility] Child Abuse Hotline 212-044-2244 or 856-125-3154 (After Hours)   Behavioral Health Services  Substance Abuse Resources: - [facility]  681-361-2427 - [facility] [phone] - [facility] [place] [phone] -  Floydene Flock  (818)[phone] - Residential & Outpatient Substance Abuse Program  [phone]  Psychological Services: [facility]  ([phone] Services  [phone] - [facility], ([phone] [place]. [address], [place], ACCESS LINE: 640-236-8557 or [phone], [employer]  Dental Assistance  If unable to pay or uninsured, contact:  [facility] or [facility]. to become qualified for the adult dental clinic.  Patients with Medicaid: [facility] [phone], [phone] [address], [phone]  If unable to pay, or uninsured, contact [facility] [phone]) or [facility] Department [phone] in [place], [phone] in [facility]) to become qualified for the adult dental clinic  Other Low-Cost Community Dental Services: - [facility]- [address] [place], [place], [place], [address], [phone], [phone], 2nd and 4th Thursday of the month at 6:30am.  10 clients each day by appointment, can sometimes see walk-in patients if someone does not show for an appointment. [facility]- [address] [place], [place], [address], [phone] - [facility]- [address], [place], [place], [address], [phone] - [facility]- [phone] [facility] Health Department- [phone] [facility] Department- [phone]

## 2012-02-05 NOTE — ED Notes (Addendum)
Pt reports was using the bathroom and had sudden onset of squeezing right side pain.  Denies injury.  Reports pain is worse with movement.  Denies difficulty breathing.

## 2012-02-05 NOTE — ED Provider Notes (Signed)
History     CSN: [id number]  Arrival date & time 02/05/12  1001   First MD Initiated Contact with Patient 02/05/12 1007      Chief Complaint  Patient presents with  . Chest Pain    (Consider location/radiation/quality/duration/timing/severity/associated sxs/prior treatment) HPI Comments: Bryan Ortega developed right flank and chest wall pain this morning when he woke and was urinating.  Pain is constant and sharp and worse with palpation movement and when he raises his right arm.  He denies chest pain has had no coughing, shortness of breath fevers or chills.  He denies any falls or other injury.  He also has had no problems with dysuria or hematuria.  Past medical history is unremarkable except for chronic back pain for which she had a lumbar laminectomy last year.  He has found no alleviators for his symptoms today.  Patient is a 41 y.o. male presenting with chest pain. The history is provided by the patient.  Chest Pain Pertinent negatives for primary symptoms include no fever, no shortness of breath and no abdominal pain.  Pertinent negatives for associated symptoms include no numbness and no weakness.     Past Medical History  Diagnosis Date  . Back pain     Past Surgical History  Procedure Date  . Lumbar laminectomy/decompression microdiscectomy 07/28/2011    Procedure: LUMBAR LAMINECTOMY/DECOMPRESSION MICRODISCECTOMY;  Surgeon: Hewitt Shorts;  Location: [facility] OR;  Service: Neurosurgery;  Laterality: Left;  Left lumbar five sacral one laminectomy and microdiskectomy  . Back surgery     No family history on file.  History  Substance Use Topics  . Smoking status: Current Everyday Smoker  . Smokeless tobacco: Not on file  . Alcohol Use: No      Review of Systems  Constitutional: Negative for fever.  Respiratory: Negative for shortness of breath.   Cardiovascular: Positive for chest pain. Negative for leg swelling.  Gastrointestinal: Negative for abdominal pain,  constipation and abdominal distention.  Genitourinary: Negative for dysuria, urgency, frequency, flank pain and difficulty urinating.  Musculoskeletal: Positive for back pain. Negative for joint swelling and gait problem.  Skin: Negative for rash.  Neurological: Negative for weakness and numbness.    Allergies  Review of patient's allergies indicates no known allergies.  Home Medications   Current Outpatient Rx  Name Route Sig Dispense Refill  . CYCLOBENZAPRINE HCL 5 MG PO TABS Oral Take 1 tablet (5 mg total) by mouth 3 (three) times daily as needed for muscle spasms. 15 tablet 0  . HYDROCODONE-ACETAMINOPHEN 5-325 MG PO TABS Oral Take 1 tablet by mouth every 4 (four) hours as needed for pain. 15 tablet 0    BP 116/63  Pulse 48  Temp 97.7 F (36.5 C) (Oral)  Resp 20  Ht 6\' 1"  (1.854 m)  Wt 150 lb (68.04 kg)  BMI 19.79 kg/m2  SpO2 99%  Physical Exam  Nursing note and vitals reviewed. Constitutional: He appears well-developed and well-nourished.  HENT:  Head: Normocephalic.  Eyes: Conjunctivae are normal.  Neck: Normal range of motion. Neck supple.  Cardiovascular: Normal rate and intact distal pulses.        Pedal pulses normal.  Pulmonary/Chest: Effort normal. No respiratory distress. He exhibits tenderness.    Abdominal: Soft. Bowel sounds are normal. He exhibits no distension and no mass. There is no tenderness. There is no rebound and no guarding.       No CVA tenderness.  Musculoskeletal: Normal range of motion. He exhibits no edema.  Lumbar back: He exhibits tenderness. He exhibits no swelling, no edema and no spasm.  Neurological: He is alert. He has normal strength. He displays no atrophy and no tremor. No sensory deficit. Gait normal.  Reflex Scores:      Patellar reflexes are 2+ on the right side and 2+ on the left side.      Achilles reflexes are 2+ on the right side and 2+ on the left side.      No strength deficit noted in hip and knee flexor and  extensor muscle groups.  Ankle flexion and extension intact.  Skin: Skin is warm and dry.  Psychiatric: He has a normal mood and affect.    ED Course  Procedures (including critical care time)   Labs Reviewed  CBC  DIFFERENTIAL  COMPREHENSIVE METABOLIC PANEL  URINALYSIS, ROUTINE W REFLEX MICROSCOPIC   Ct Abdomen Pelvis Wo Contrast  02/05/2012  *RADIOLOGY REPORT*  Clinical Data: Right flank pain  CT ABDOMEN AND PELVIS WITHOUT CONTRAST  Technique:  Multidetector CT imaging of the abdomen and pelvis was performed following the standard protocol without intravenous contrast.  Comparison: None.  Findings: Limited images through the lung bases demonstrate no significant appreciable abnormality. The heart size is within normal limits. No pleural or pericardial effusion.  Organ abnormality/lesion detection is limited in the absence of intravenous contrast. Within this limitation, unremarkable liver, biliary system, spleen, pancreas, adrenal glands.  Symmetric renal size.  No hydronephrosis or hydroureter.  Difficult to follow the ureters in their entirety due to decompressed state however no calcifications along the expected course.  No bowel obstruction.  No CT evidence for colitis.  Normal appendix.  No free intraperitoneal air or fluid.  No lymphadenopathy.  Normal caliber vasculature.  Decompressed bladder with circumferential thickening.  No inguinal lymphadenopathy.  No acute osseous finding.  IMPRESSION: No hydronephrosis or urinary tract calculi identified.  Normal appendix.  Original Report Authenticated By: Waneta Martins, M.D.   Dg Chest Port 1 View  02/05/2012  *RADIOLOGY REPORT*  Clinical Data: Right-sided chest pain.  PORTABLE CHEST - 1 VIEW  Comparison: 01/23/2009  Findings: Heart and mediastinal contours are within normal limits. No focal opacities or effusions.  No acute bony abnormality.  IMPRESSION: No active cardiopulmonary disease.  Original Report Authenticated By: Cyndie Chime, M.D.     1. Flank pain, acute   2. Chest wall pain     Date: 02/05/2012  Rate: 45  Rhythm: sinus bradycardia  QRS Axis: normal  Intervals: normal  ST/T Wave abnormalities: normal  Conduction Disutrbances:first-degree A-V block   Narrative Interpretation:   Old EKG Reviewed: unchanged except for rate    MDM  Patient with reproducible chest wall pain without sob,  Worsened with movement most suggestive of muscle spasm/body wall origin.  Vitals normal with no tachycardia and no sob,  Low risk for PE.  Discussed with Dr. Judd Lien prior to dc home who also saw pt.        Burgess Amor, [place] 02/05/12 1423

## 2012-02-05 NOTE — ED Provider Notes (Signed)
Medical screening examination/treatment/procedure(s) were conducted as a shared visit with non-physician practitioner(s) and myself.  I personally evaluated the patient during the encounter.  The patient presented complaining of pain in the right flank and lower rib cage which began acutely while he was urinating this morning.  It has come and gone since that time.  He denies any fever or blood in the urine and he denies having injured himself.  On exam, he is ttp in the right flank, but the abdomen is otherwise benign.  The heart and lung exam is unremarkable.  The workup reveals a normal ecg, labs, and chest xray.  As the symptoms began acutely while urinating, a ct was performed to rule out kidney stone.  This was done and is unremarkable.  The pain seems almost certain to be musculoskeletal in nature.  He will be discharged with pain meds and antiinflammatories.    Geoffery Lyons, MD 02/05/12 [id number]

## 2012-02-05 NOTE — ED Notes (Signed)
Pt is aware of a urine sample, pt states he is unable to void at this time.  

## 2012-02-05 NOTE — ED Notes (Addendum)
PA at bedside to access pt.

## 2012-03-21 ENCOUNTER — Emergency Department (HOSPITAL_COMMUNITY): Payer: Self-pay

## 2012-03-21 ENCOUNTER — Encounter (HOSPITAL_COMMUNITY): Payer: Self-pay | Admitting: Emergency Medicine

## 2012-03-21 ENCOUNTER — Emergency Department (HOSPITAL_COMMUNITY)
Admission: EM | Admit: 2012-03-21 | Discharge: 2012-03-21 | Disposition: A | Discharge status: Home / Self Care | Payer: Self-pay | Attending: Emergency Medicine | Admitting: Emergency Medicine

## 2012-03-21 DIAGNOSIS — L03211 Cellulitis of face: Secondary | ICD-10-CM | POA: Insufficient documentation

## 2012-03-21 DIAGNOSIS — L0201 Cutaneous abscess of face: Secondary | ICD-10-CM | POA: Insufficient documentation

## 2012-03-21 DIAGNOSIS — F17200 Nicotine dependence, unspecified, uncomplicated: Secondary | ICD-10-CM | POA: Insufficient documentation

## 2012-03-21 DIAGNOSIS — L0390 Cellulitis, unspecified: Secondary | ICD-10-CM

## 2012-03-21 LAB — BASIC METABOLIC PANEL
CO2: 29 mEq/L (ref 19–32)
Chloride: 103 mEq/L (ref 96–112)
GFR calc Af Amer: >90 mL/min (ref >90)
Potassium: 3.4 mEq/L — ABNORMAL LOW (ref 3.5–5.1)

## 2012-03-21 MED ORDER — CEPHALEXIN 500 MG PO CAPS: 500.0000 mg | ORAL_CAPSULE | Freq: Four times a day (QID) | ORAL | Status: AC

## 2012-03-21 MED ORDER — ONDANSETRON HCL 4 MG PO TABS
4.0000 mg | ORAL_TABLET | Freq: Once | ORAL | Status: AC
  Administered 2012-03-21: 4 mg via ORAL
  Filled 2012-03-21: qty 1

## 2012-03-21 MED ORDER — HYDROCODONE-ACETAMINOPHEN 5-325 MG PO TABS: 1.0000 | ORAL_TABLET | ORAL | Status: AC | PRN

## 2012-03-21 MED ORDER — CEPHALEXIN 500 MG PO CAPS
500.0000 mg | ORAL_CAPSULE | Freq: Once | ORAL | Status: AC
  Administered 2012-03-21: 500 mg via ORAL
  Filled 2012-03-21: qty 1

## 2012-03-21 MED ORDER — HYDROCODONE-ACETAMINOPHEN 5-325 MG PO TABS
2.0000 | ORAL_TABLET | Freq: Once | ORAL | Status: AC
  Administered 2012-03-21: 2 via ORAL
  Filled 2012-03-21: qty 2

## 2012-03-21 MED ORDER — SULFAMETHOXAZOLE-TMP DS 800-160 MG PO TABS
1.0000 | ORAL_TABLET | Freq: Once | ORAL | Status: AC
  Administered 2012-03-21: 1 via ORAL
  Filled 2012-03-21: qty 1

## 2012-03-21 MED ORDER — SULFAMETHOXAZOLE-TRIMETHOPRIM 800-160 MG PO TABS: 1.0000 | ORAL_TABLET | Freq: Two times a day (BID) | ORAL | Status: AC

## 2012-03-21 MED ORDER — IOHEXOL 300 MG/ML  SOLN
80.0000 mL | Freq: Once | INTRAMUSCULAR | Status: AC | PRN
  Administered 2012-03-21: 80 mL via INTRAVENOUS

## 2012-03-21 NOTE — ED Notes (Signed)
Pt c/o "knot" under chin area, pt states that he noticed it on Wednesday, denies any injury, drainage. Denies any change in razors or shaving creams, does state that he had a cold last week, [place] PA in room with pt at present time,

## 2012-03-21 NOTE — ED Provider Notes (Signed)
Medical screening examination/treatment/procedure(s) were performed by non-physician practitioner and as supervising physician I was immediately available for consultation/collaboration.   Joya Gaskins, MD 03/21/12 [phone]

## 2012-03-21 NOTE — ED Notes (Signed)
Patient is resting comfortably. 

## 2012-03-21 NOTE — ED Notes (Signed)
Pt medicated. States he has a ride home.

## 2012-03-21 NOTE — ED Notes (Signed)
Pt returned from ct scan

## 2012-03-21 NOTE — ED Notes (Signed)
Ct notified of pt lab work results,

## 2012-03-21 NOTE — ED Notes (Signed)
Family at bedside. 

## 2012-03-21 NOTE — ED Notes (Signed)
Patient is comfortable at this time. 

## 2012-03-21 NOTE — ED Notes (Signed)
Patient c/o swelling and "soreness under jaw x2 days. Per patient had cold last week. Denies any fevers.

## 2012-03-21 NOTE — ED Provider Notes (Signed)
History     CSN: [id number]  Arrival date & time 03/21/12  4098   First MD Initiated Contact with Patient 03/21/12 [phone]      Chief Complaint  Patient presents with  . Facial Swelling    (Consider location/radiation/quality/duration/timing/severity/associated sxs/prior treatment) HPI Comments: Patient presents with a 2 to three-day history of swelling and soreness under the chin area. The patient denies any injury. He does state that he shaves frequently but he has not been using a new razor or new shaving cream. The patient denies any previous surgeries or procedures in this area. He denies any problem swallowing or breathing. There's been no significant temperature elevations appreciated. Patient presents at this time for evaluation of these problems.  The history is provided by the patient.    Past Medical History  Diagnosis Date  . Back pain     Past Surgical History  Procedure Date  . Lumbar laminectomy/decompression microdiscectomy 07/28/2011    Procedure: LUMBAR LAMINECTOMY/DECOMPRESSION MICRODISCECTOMY;  Surgeon: Hewitt Shorts;  Location: [facility] OR;  Service: Neurosurgery;  Laterality: Left;  Left lumbar five sacral one laminectomy and microdiskectomy  . Back surgery     No family history on file.  History  Substance Use Topics  . Smoking status: Current Everyday Smoker -- 0.0 packs/day for 20 years    Types: Cigarettes  . Smokeless tobacco: Never Used  . Alcohol Use: No      Review of Systems  Constitutional: Negative for activity change.       All ROS Neg except as noted in HPI  HENT: Negative for nosebleeds and neck pain.   Eyes: Negative for photophobia and discharge.  Respiratory: Negative for cough, shortness of breath and wheezing.   Cardiovascular: Negative for chest pain and palpitations.  Gastrointestinal: Negative for abdominal pain and blood in stool.  Genitourinary: Negative for dysuria, frequency and hematuria.  Musculoskeletal: Positive for  back pain. Negative for arthralgias.  Skin: Negative.   Neurological: Negative for dizziness, seizures and speech difficulty.  Psychiatric/Behavioral: Negative for hallucinations and confusion.    Allergies  Review of patient's allergies indicates no known allergies.  Home Medications   Current Outpatient Rx  Name Route Sig Dispense Refill  . NAPROXEN SODIUM 220 MG PO TABS Oral Take 220 mg by mouth 2 (two) times daily with a meal.      BP 109/71  Pulse 45  Temp 97.6 F (36.4 C) (Oral)  Resp 16  Ht 6\' 1"  (1.854 m)  Wt 150 lb (68.04 kg)  BMI 19.79 kg/m2  SpO2 100%  Physical Exam  Nursing note and vitals reviewed. Constitutional: He is oriented to person, place, and time. He appears well-developed and well-nourished.  Non-toxic appearance.  HENT:  Head: Normocephalic.  Right Ear: Tympanic membrane and external ear normal.  Left Ear: Tympanic membrane and external ear normal.       The submental area under the chin is warm to touch but not hot. There is no red streaking appreciated. There is no palpable abscess. There is prominence of the tissue in the submental area under the chin. The trachea is in the midline. The patient has no problem swallowing. There no cervical lymph nodes palpable.  Eyes: EOM and lids are normal. Pupils are equal, round, and reactive to light.  Neck: Normal range of motion. Neck supple. Carotid bruit is not present.  Cardiovascular: Regular rhythm, S1 normal, intact distal pulses and normal pulses.  Bradycardia present.   Murmur heard.  Systolic murmur is  present with a grade of 2/6  Pulmonary/Chest: Breath sounds normal. No respiratory distress.  Abdominal: Soft. Bowel sounds are normal. There is no tenderness. There is no guarding.  Musculoskeletal: Normal range of motion.  Lymphadenopathy:       Head (right side): No submandibular adenopathy present.       Head (left side): No submandibular adenopathy present.    He has no cervical adenopathy.    Neurological: He is alert and oriented to person, place, and time. He has normal strength. No cranial nerve deficit or sensory deficit.  Skin: Skin is warm and dry.  Psychiatric: He has a normal mood and affect. His speech is normal.    ED Course  Procedures (including critical care time)  Labs Reviewed  BASIC METABOLIC PANEL - Abnormal; Notable for the following:    Potassium 3.4 (*)     Glucose, Bld 101 (*)     All other components within normal limits   Ct Soft Tissue Neck W Contrast  03/21/2012  *RADIOLOGY REPORT*  Clinical Data: Small palpable area under chin.  CT NECK WITH CONTRAST  Technique:  Multidetector CT imaging of the neck was performed with intravenous contrast.  Contrast: 80mL OMNIPAQUE IOHEXOL 300 MG/ML  SOLN  Comparison: None.  Findings: A BB marks a palpable area in the submental area.  Small 3 mm dermal soft tissue nodule is seen in this area with mild thickening of the subcutaneous tissues in the surrounding area.  No abscess is seen.  This may be a small area of dermal infection and cellulitis.  Small submental lymph nodes are present.  Negative for mass.  No pathologic adenopathy.  Submandibular and parotid glands are normal bilaterally.  Larynx is normal.  Thyroid is normal.  Mild apical pleural scarring bilaterally.  Dental infection with periapical lucency around right upper molar.  IMPRESSION: Small dermal nodule is present under the chin, most likely due to localized infection and cellulitis.  No abscess or mass is present.  Original Report Authenticated By: Camelia Phenes, M.D.     No diagnosis found.    MDM  I have reviewed nursing notes, vital signs, and all appropriate lab and imaging results for this patient. The CT scan of the soft tissue of the neck reveals a small dermal node under the chin that is expected to be related to local infection and cellulitis. No mass or abscess was appreciated. The plan at this time is for the patient to be on Keflex and  Septra as well as Norco for pain. The patient is to see his primary physician or return to the emergency department if any changes, problems, or concerns.       Kathie Dike, [place] 03/21/12 1223

## 2012-07-02 ENCOUNTER — Encounter (HOSPITAL_COMMUNITY): Payer: Self-pay

## 2012-07-02 ENCOUNTER — Emergency Department (HOSPITAL_COMMUNITY): Payer: Self-pay

## 2012-07-02 ENCOUNTER — Emergency Department (HOSPITAL_COMMUNITY)
Admission: EM | Admit: 2012-07-02 | Discharge: 2012-07-02 | Disposition: A | Discharge status: Home / Self Care | Payer: Self-pay | Attending: Emergency Medicine | Admitting: Emergency Medicine

## 2012-07-02 DIAGNOSIS — M545 Low back pain, unspecified: Secondary | ICD-10-CM | POA: Insufficient documentation

## 2012-07-02 DIAGNOSIS — Z791 Long term (current) use of non-steroidal anti-inflammatories (NSAID): Secondary | ICD-10-CM | POA: Insufficient documentation

## 2012-07-02 DIAGNOSIS — M549 Dorsalgia, unspecified: Secondary | ICD-10-CM

## 2012-07-02 DIAGNOSIS — F17200 Nicotine dependence, unspecified, uncomplicated: Secondary | ICD-10-CM | POA: Insufficient documentation

## 2012-07-02 DIAGNOSIS — G8929 Other chronic pain: Secondary | ICD-10-CM | POA: Insufficient documentation

## 2012-07-02 MED ORDER — CYCLOBENZAPRINE HCL 10 MG PO TABS: 10.0000 mg | ORAL_TABLET | Freq: Two times a day (BID) | ORAL | Status: DC | PRN

## 2012-07-02 MED ORDER — PREDNISONE 20 MG PO TABS: 60.0000 mg | ORAL_TABLET | Freq: Every day | ORAL | Status: DC

## 2012-07-02 MED ORDER — OXYCODONE-ACETAMINOPHEN 5-325 MG PO TABS: 1.0000 | ORAL_TABLET | Freq: Four times a day (QID) | ORAL | Status: DC | PRN

## 2012-07-02 NOTE — ED Notes (Signed)
Pt c/o chronic lower back pain, radiating down legs intermittently.  Denies recent injury.

## 2012-07-02 NOTE — ED Provider Notes (Signed)
History   This chart was scribed for Donnetta Hutching, MD by Charolett Bumpers, ER Scribe. The patient was seen in room APA11/APA11. Patient's care was started at 0844.   CSN: [id number]  Arrival date & time 07/02/12  0820   First MD Initiated Contact with Patient 07/02/12 [phone]      Chief Complaint  Patient presents with  . Back Pain    The history is provided by the patient. No language interpreter was used.   Bryan Ortega is a 41 y.o. male who presents to the Emergency Department complaining of chronic severe lower back pain that radiates down his right leg intermittently. He reports a h/o lumbar laminectomy by Dr. Jule Ser on 07/28/11. He denies any recent injuries but states that he does a lot of lifting at his job that he has had for the past 6 months and the back pain has been worsening since. He states that he ambulates normally. He states he is otherwise normally healthy and denies taking any regular medications.    Past Medical History  Diagnosis Date  . Back pain     Past Surgical History  Procedure Date  . Lumbar laminectomy/decompression microdiscectomy 07/28/2011    Procedure: LUMBAR LAMINECTOMY/DECOMPRESSION MICRODISCECTOMY;  Surgeon: Hewitt Shorts;  Location: [facility] OR;  Service: Neurosurgery;  Laterality: Left;  Left lumbar five sacral one laminectomy and microdiskectomy  . Back surgery     No family history on file.  History  Substance Use Topics  . Smoking status: Current Every Day Smoker -- 0.0 packs/day for 20 years    Types: Cigarettes  . Smokeless tobacco: Never Used  . Alcohol Use: No      Review of Systems A complete 10 system review of systems was obtained and all systems are negative except as noted in the HPI and PMH.   Allergies  Review of patient's allergies indicates no known allergies.  Home Medications   Current Outpatient Rx  Name  Route  Sig  Dispense  Refill  . NAPROXEN SODIUM 220 MG PO TABS   Oral   Take 220 mg by mouth 2 (two)  times daily with a meal.           BP 109/73  Pulse 68  Temp 98.7 F (37.1 C) (Oral)  Resp 18  Ht 6\' 1"  (1.854 m)  Wt 150 lb (68.04 kg)  BMI 19.79 kg/m2  SpO2 97%  Physical Exam  Nursing note and vitals reviewed. Constitutional: He is oriented to person, place, and time. He appears well-developed and well-nourished.  HENT:  Head: Normocephalic and atraumatic.  Eyes: Conjunctivae normal and EOM are normal. Pupils are equal, round, and reactive to light.  Neck: Normal range of motion. Neck supple.  Cardiovascular: Normal rate, regular rhythm and normal heart sounds.   Pulmonary/Chest: Effort normal and breath sounds normal. No respiratory distress.  Abdominal: Soft. Bowel sounds are normal. He exhibits no distension. There is no tenderness.  Musculoskeletal: Normal range of motion. He exhibits tenderness.       Moderate lumbar tenderness.   Neurological: He is alert and oriented to person, place, and time.       Ambulates without difficulty.   Skin: Skin is warm and dry.  Psychiatric: He has a normal mood and affect.    ED Course  Procedures (including critical care time)  DIAGNOSTIC STUDIES: Oxygen Saturation is 97% on room air, normal by my interpretation.    COORDINATION OF CARE:  09:12-Discussed planned course of treatment with  the patient including an x-ray of lumbar spine, who is agreeable at this time.    Labs Reviewed - No data to display Dg Lumbar Spine Complete  07/02/2012  *RADIOLOGY REPORT*  Clinical Data: Low back pain for 3 months.  No acute injury.  Heavy lifting at work.  LUMBAR SPINE - COMPLETE 4+ VIEW  Comparison: Abdominal pelvic CT 01/26/2012.  Lumbar spine radiographs 07/25/2011.  Findings: There are five lumbar type vertebral bodies.  The alignment is normal.  The disc spaces are preserved.  There is no evidence of acute fracture or pars defect.  IMPRESSION: Stable examination.  No acute osseous findings or malalignment.   Original Report  Authenticated By: Carey Bullocks, M.D.      No diagnosis found.    MDM  Patient is ambulatory. minimal radiculopathy right greater than left.. No bowel or bladder incontinence.  Rx Percocet, prednisone, Flexeril.   I personally performed the services described in this documentation, which was scribed in my presence. The recorded information has been reviewed and is accurate.        Donnetta Hutching, MD 07/02/12 1126

## 2012-07-02 NOTE — ED Notes (Signed)
MD at bedside. 

## 2013-06-18 ENCOUNTER — Emergency Department (HOSPITAL_COMMUNITY)
Admission: EM | Admit: 2013-06-18 | Discharge: 2013-06-18 | Disposition: A | Discharge status: Home / Self Care | Payer: Self-pay | Attending: Emergency Medicine | Admitting: Emergency Medicine

## 2013-06-18 ENCOUNTER — Encounter (HOSPITAL_COMMUNITY): Payer: Self-pay | Admitting: Emergency Medicine

## 2013-06-18 DIAGNOSIS — F17200 Nicotine dependence, unspecified, uncomplicated: Secondary | ICD-10-CM | POA: Insufficient documentation

## 2013-06-18 DIAGNOSIS — M549 Dorsalgia, unspecified: Secondary | ICD-10-CM

## 2013-06-18 DIAGNOSIS — M545 Low back pain, unspecified: Secondary | ICD-10-CM | POA: Insufficient documentation

## 2013-06-18 DIAGNOSIS — Z9889 Other specified postprocedural states: Secondary | ICD-10-CM | POA: Insufficient documentation

## 2013-06-18 MED ORDER — HYDROCODONE-ACETAMINOPHEN 5-325 MG PO TABS: 1.0000 | ORAL_TABLET | Freq: Four times a day (QID) | ORAL | Status: DC | PRN

## 2013-06-18 MED ORDER — IBUPROFEN 600 MG PO TABS: 600.0000 mg | ORAL_TABLET | Freq: Four times a day (QID) | ORAL | Status: DC | PRN

## 2013-06-18 MED ORDER — CYCLOBENZAPRINE HCL 10 MG PO TABS: 10.0000 mg | ORAL_TABLET | Freq: Three times a day (TID) | ORAL | Status: DC | PRN

## 2013-06-18 MED ORDER — OXYCODONE-ACETAMINOPHEN 5-325 MG PO TABS
1.0000 | ORAL_TABLET | Freq: Once | ORAL | Status: AC
  Administered 2013-06-18: 1 via ORAL
  Filled 2013-06-18: qty 1

## 2013-06-18 NOTE — ED Provider Notes (Signed)
CSN: [id number]     Arrival date & time 06/18/13  1214 History   First MD Initiated Contact with Patient 06/18/13 1252     No chief complaint on file.  (Consider location/radiation/quality/duration/timing/severity/associated sxs/prior Treatment) HPI Bryan Ortega is a 42 y.o. male who presents to emergency department complaining of back pain. Patient states he has chronic back pain and had surgery on his lumbar spine by Dr. Jule Ser a year ago. States the surgery has helped his pain up until just a few weeks ago. States this pain started 2 days ago. States he works long hours and has to stand on his feet all day long. States he has no injuries that he knows of. States pain is in the left back and radiates intermittently to both legs. He denies numbness or weakness in his legs. He denies loss of bladder or bowel control. Denies abdominal pain. He denies any fever. He took ibuprofen for his pain which did not improve his symptoms  Past Medical History  Diagnosis Date  . Back pain    Past Surgical History  Procedure Laterality Date  . Lumbar laminectomy/decompression microdiscectomy  07/28/2011    Procedure: LUMBAR LAMINECTOMY/DECOMPRESSION MICRODISCECTOMY;  Surgeon: Hewitt Shorts;  Location: [facility] OR;  Service: Neurosurgery;  Laterality: Left;  Left lumbar five sacral one laminectomy and microdiskectomy  . Back surgery     History reviewed. No pertinent family history. History  Substance Use Topics  . Smoking status: Current Every Day Smoker -- 0.02 packs/day for 20 years    Types: Cigarettes  . Smokeless tobacco: Never Used  . Alcohol Use: No    Review of Systems  Constitutional: Negative for fever and chills.  Respiratory: Negative for cough, chest tightness and shortness of breath.   Cardiovascular: Negative for chest pain, palpitations and leg swelling.  Gastrointestinal: Negative for nausea, vomiting, abdominal pain, diarrhea and abdominal distention.  Genitourinary: Negative for  dysuria, urgency, frequency, hematuria and flank pain.  Musculoskeletal: Positive for back pain. Negative for neck pain and neck stiffness.  Skin: Negative for rash.  Allergic/Immunologic: Negative for immunocompromised state.  Neurological: Negative for dizziness, weakness, light-headedness, numbness and headaches.    Allergies  Review of patient's allergies indicates no known allergies.  Home Medications   Current Outpatient Rx  Name  Route  Sig  Dispense  Refill  . ibuprofen (ADVIL,MOTRIN) 200 MG tablet   Oral   Take 400 mg by mouth every 6 (six) hours as needed for pain.          BP 110/71  Pulse 65  Temp(Src) 97.7 F (36.5 C) (Oral)  Resp 18  SpO2 99% Physical Exam  Nursing note and vitals reviewed. Constitutional: He appears well-developed and well-nourished. No distress.  HENT:  Head: Normocephalic.  Neck: Normal range of motion. Neck supple.  Cardiovascular: Normal rate, regular rhythm and normal heart sounds.   Pulmonary/Chest: Effort normal and breath sounds normal. No respiratory distress. He has no wheezes. He has no rales.  Abdominal: Soft. There is no tenderness.  Musculoskeletal:  Old surgical incision to the lumbar spine, appears normal. Tender to palpation over midline lumbar spine and left paravertebral soft spine. Pain with bilateral straight leg raise.  Neurological:  5/5 and equal lower extremity strength. 2+ and equal patellar reflexes bilaterally. Pt able to dorsiflex bilateral toes and feet with good strength against resistance. Equal sensation bilaterally over thighs and lower legs.    Skin: Skin is warm and dry.    ED Course  Procedures (  including critical care time) Labs Review Labs Reviewed - No data to display Imaging Review No results found.  EKG Interpretation   None       MDM   1. Back pain     Patient with acute on chronic exacerbation of lower back pain. He has not had any injuries. He does not have any neuro deficits to  suggest cauda equina. He is neurovascularly intact. Patient had little relief with ibuprofen at home. He is asking for more pain medication. Will start on ibuprofen, Norco, Flexeril. Followup with his neurosurgeon.  Filed Vitals:   06/18/13 1229  BP: 110/71  Pulse: 65  Temp: 97.7 F (36.5 C)  TempSrc: Oral  Resp: 18  SpO2: 99%       Uniqua Kihn A Jaking Thayer, PA-C 06/18/13 1326

## 2013-06-18 NOTE — ED Notes (Signed)
Pt presents with 2 day h/o low back pain.  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  Pt denies any urinary or fecal incontinence.

## 2013-06-21 NOTE — ED Provider Notes (Signed)
Medical screening examination/treatment/procedure(s) were performed by non-physician practitioner and as supervising physician I was immediately available for consultation/collaboration.  Kamaree Berkel M Avonlea Sima, MD 06/21/13 1850 

## 2014-10-27 ENCOUNTER — Encounter (HOSPITAL_COMMUNITY): Payer: Self-pay | Admitting: *Deleted

## 2014-10-27 ENCOUNTER — Emergency Department (HOSPITAL_COMMUNITY)
Admission: EM | Admit: 2014-10-27 | Discharge: 2014-10-27 | Disposition: A | Discharge status: Home / Self Care | Payer: BLUE CROSS/BLUE SHIELD | Attending: Emergency Medicine | Admitting: Emergency Medicine

## 2014-10-27 DIAGNOSIS — H109 Unspecified conjunctivitis: Secondary | ICD-10-CM | POA: Diagnosis not present

## 2014-10-27 DIAGNOSIS — Z720 Tobacco use: Secondary | ICD-10-CM | POA: Diagnosis not present

## 2014-10-27 DIAGNOSIS — H5711 Ocular pain, right eye: Secondary | ICD-10-CM | POA: Diagnosis present

## 2014-10-27 MED ORDER — POLYMYXIN B-TRIMETHOPRIM 10000-0.1 UNIT/ML-% OP SOLN
2.0000 [drp] | Freq: Four times a day (QID) | OPHTHALMIC | Status: DC
  Administered 2014-10-27: 2 [drp] via OPHTHALMIC
  Filled 2014-10-27 (×2): qty 10

## 2014-10-27 MED ORDER — FLUORESCEIN SODIUM 1 MG OP STRP
1.0000 | ORAL_STRIP | Freq: Once | OPHTHALMIC | Status: AC
  Administered 2014-10-27: 1 via OPHTHALMIC
  Filled 2014-10-27: qty 1

## 2014-10-27 MED ORDER — TETRACAINE HCL 0.5 % OP SOLN
2.0000 [drp] | Freq: Once | OPHTHALMIC | Status: AC
  Administered 2014-10-27: 2 [drp] via OPHTHALMIC
  Filled 2014-10-27: qty 2

## 2014-10-27 NOTE — ED Notes (Signed)
Instructed to go to Dr. Eliane DecreePatel's office at 1330.

## 2014-10-27 NOTE — Discharge Instructions (Signed)
Please go to Dr. Jill AlexandersPatels office today at 1:30 PM. Explained to the office staff we talked to her earlier and she was going to squeeze you into schedule.    Conjunctivitis Conjunctivitis is commonly called "pink eye." Conjunctivitis can be caused by bacterial or viral infection, allergies, or injuries. There is usually redness of the lining of the eye, itching, discomfort, and sometimes discharge. There may be deposits of matter along the eyelids. A viral infection usually causes a watery discharge, while a bacterial infection causes a yellowish, thick discharge. Pink eye is very contagious and spreads by direct contact. You may be given antibiotic eyedrops as part of your treatment. Before using your eye medicine, remove all drainage from the eye by washing gently with warm water and cotton balls. Continue to use the medication until you have awakened 2 mornings in a row without discharge from the eye. Do not rub your eye. This increases the irritation and helps spread infection. Use separate towels from other household members. Wash your hands with soap and water before and after touching your eyes. Use cold compresses to reduce pain and sunglasses to relieve irritation from light. Do not wear contact lenses or wear eye makeup until the infection is gone. SEEK MEDICAL CARE IF:   Your symptoms are not better after 3 days of treatment.  You have increased pain or trouble seeing.  The outer eyelids become very red or swollen. Document Released: 09/13/2004 Document Revised: 10/29/2011 Document Reviewed: 08/06/2005 [facility] Patient Information 2015 [employer] [place]. This information is not intended to replace advice given to you by your health care provider. Make sure you discuss any questions you have with your health care provider.

## 2014-10-27 NOTE — ED Provider Notes (Signed)
CSN: [id number]     Arrival date & time 10/27/14  0903 History   First MD Initiated Contact with Patient 10/27/14 [phone]     Chief Complaint  Patient presents with  . Eye Pain     (Consider location/radiation/quality/duration/timing/severity/associated sxs/prior Treatment) HPI Bryan Ortega is a 44 y.o. male with no medical problems presents to emergency department complaining of nontraumatic right eye pain that started 2 days ago. States yesterday he had mild discomfort in the right eye, this morning he woke up with right eye redness, photophobia, associated headache. He denies any visual changes. He denies any drainage from the eye. States eye is not itchy. No injuries to the eye. No exposure to bright lights or chemicals. There is no history of similar symptoms in the past. He does not wear contacts or glasses. Does not have an ophthalmologist. He has tried over-the-counter eyedrops with no relief of his symptoms.   Past Medical History  Diagnosis Date  . Back pain    Past Surgical History  Procedure Laterality Date  . Lumbar laminectomy/decompression microdiscectomy  07/28/2011    Procedure: LUMBAR LAMINECTOMY/DECOMPRESSION MICRODISCECTOMY;  Surgeon: Hewitt Shorts;  Location: [facility] OR;  Service: Neurosurgery;  Laterality: Left;  Left lumbar five sacral one laminectomy and microdiskectomy  . Back surgery     History reviewed. No pertinent family history. History  Substance Use Topics  . Smoking status: Current Every Day Smoker -- 0.02 packs/day for 20 years    Types: Cigarettes  . Smokeless tobacco: Never Used  . Alcohol Use: No    Review of Systems  HENT: Negative for congestion and facial swelling.   Eyes: Positive for photophobia, pain and redness. Negative for discharge, itching and visual disturbance.  Neurological: Positive for headaches.  All other systems reviewed and are negative.     Allergies  Review of patient's allergies indicates no known allergies.  Home  Medications   Prior to Admission medications   Medication Sig Start Date End Date Taking? Authorizing Provider  cyclobenzaprine (FLEXERIL) 10 MG tablet Take 1 tablet (10 mg total) by mouth 3 (three) times daily as needed for muscle spasms. 06/18/13   Junnie Loschiavo, PA-C  HYDROcodone-acetaminophen (NORCO) 5-325 MG per tablet Take 1 tablet by mouth every 6 (six) hours as needed for pain. 06/18/13   Aniruddh Ciavarella, PA-C  ibuprofen (ADVIL,MOTRIN) 200 MG tablet Take 400 mg by mouth every 6 (six) hours as needed for pain.    Historical Provider, MD  ibuprofen (ADVIL,MOTRIN) 600 MG tablet Take 1 tablet (600 mg total) by mouth every 6 (six) hours as needed for pain. 06/18/13   Janie Strothman, PA-C   BP 111/77 mmHg  Pulse 50  Temp(Src) 98 F (36.7 C) (Oral)  Resp 16  SpO2 100% Physical Exam  Constitutional: He is oriented to person, place, and time. He appears well-developed and well-nourished. No distress.  HENT:  Head: Normocephalic and atraumatic.  Right Ear: External ear normal.  Left Ear: External ear normal.  Nose: Nose normal.  Mouth/Throat: Oropharynx is clear and moist.  Eyes: EOM and lids are normal. Lids are everted and swept, no foreign bodies found. Right conjunctiva is injected. Right conjunctiva has no hemorrhage. Left conjunctiva is not injected. Left conjunctiva has no hemorrhage. Pupils are unequal.  Slit lamp exam:      The right eye shows no fluorescein uptake.  Right pupil constricted, poorly reactive. Right eye pressure averaged to 10  Neck: Neck supple.  Cardiovascular: Normal rate, regular rhythm and normal  heart sounds.   Pulmonary/Chest: Effort normal. No respiratory distress. He has no wheezes. He has no rales.  Musculoskeletal: He exhibits no edema.  Neurological: He is alert and oriented to person, place, and time.  Skin: Skin is warm and dry.  Nursing note and vitals reviewed.   ED Course  Procedures (including critical care time) Labs  Review Labs Reviewed - No data to display  Imaging Review No results found.   EKG Interpretation None      MDM   Final diagnoses:  Conjunctivitis, right eye   Visual Acuity    Bilateral Near             Bilateral Distance  20/30           R Near             R Distance  20/40           L Near             L Distance  20/30             Right eye pressure measured with tonometer ave 10  10:22 AM Discussed with Dr. Allena KatzPatel with ophthalmology, she advised Polytrim drops for now, she will see patient 1:30 this afternoon. She stated this is most likely just severe conjunctivitis. Discussed with patient, he will go to her office at 1:30 this afternoon for further evaluation.  Filed Vitals:   10/27/14 0908  BP: 111/77  Pulse: 50  Temp: 98 F (36.7 C)  TempSrc: Oral  Resp: 16  SpO2: 100%     Jaynie Crumbleatyana Perina Salvaggio, PA-C 10/27/14 1028  Derwood KaplanAnkit Nanavati, MD 10/29/14 [phone]

## 2014-10-27 NOTE — ED Notes (Signed)
Pt reports right eye pain x 2 days, blurred vision, sensitivity to light.

## 2015-04-17 ENCOUNTER — Emergency Department (HOSPITAL_COMMUNITY): Payer: BLUE CROSS/BLUE SHIELD | Admitting: Certified Registered"

## 2015-04-17 ENCOUNTER — Emergency Department (HOSPITAL_COMMUNITY): Admission: EM | Admit: 2015-04-17 | Payer: BLUE CROSS/BLUE SHIELD | Source: Home / Self Care

## 2015-04-17 ENCOUNTER — Encounter (HOSPITAL_COMMUNITY)
Admission: EM | Disposition: A | Discharge status: Home / Self Care | Payer: Self-pay | Source: Home / Self Care | Attending: Emergency Medicine

## 2015-04-17 ENCOUNTER — Ambulatory Visit (HOSPITAL_COMMUNITY)
Admission: EM | Admit: 2015-04-17 | Discharge: 2015-04-17 | Disposition: A | Discharge status: Home / Self Care | Payer: BLUE CROSS/BLUE SHIELD | Attending: Emergency Medicine | Admitting: Emergency Medicine

## 2015-04-17 ENCOUNTER — Encounter (HOSPITAL_COMMUNITY): Payer: Self-pay | Admitting: Emergency Medicine

## 2015-04-17 ENCOUNTER — Emergency Department (HOSPITAL_COMMUNITY): Payer: BLUE CROSS/BLUE SHIELD

## 2015-04-17 DIAGNOSIS — S069X0A Unspecified intracranial injury without loss of consciousness, initial encounter: Secondary | ICD-10-CM | POA: Insufficient documentation

## 2015-04-17 DIAGNOSIS — R6884 Jaw pain: Secondary | ICD-10-CM | POA: Diagnosis not present

## 2015-04-17 DIAGNOSIS — S0264XA Fracture of ramus of mandible, initial encounter for closed fracture: Secondary | ICD-10-CM | POA: Insufficient documentation

## 2015-04-17 DIAGNOSIS — S02609A Fracture of mandible, unspecified, initial encounter for closed fracture: Secondary | ICD-10-CM | POA: Diagnosis present

## 2015-04-17 DIAGNOSIS — M264 Malocclusion, unspecified: Secondary | ICD-10-CM | POA: Insufficient documentation

## 2015-04-17 DIAGNOSIS — F17210 Nicotine dependence, cigarettes, uncomplicated: Secondary | ICD-10-CM | POA: Insufficient documentation

## 2015-04-17 HISTORY — PX: ORIF MANDIBULAR FRACTURE: SHX2127

## 2015-04-17 SURGERY — OPEN REDUCTION INTERNAL FIXATION (ORIF) MANDIBULAR FRACTURE
Anesthesia: General | Site: Face

## 2015-04-17 MED ORDER — ONDANSETRON HCL 4 MG/2ML IJ SOLN
INTRAMUSCULAR | Status: DC | PRN
  Administered 2015-04-17: 4 mg via INTRAVENOUS

## 2015-04-17 MED ORDER — HYDROCODONE-ACETAMINOPHEN 7.5-325 MG/15ML PO SOLN: 15.0000 mL | Freq: Four times a day (QID) | ORAL | Status: DC | PRN

## 2015-04-17 MED ORDER — MIDAZOLAM HCL 2 MG/2ML IJ SOLN
INTRAMUSCULAR | Status: AC
  Filled 2015-04-17: qty 4

## 2015-04-17 MED ORDER — FENTANYL CITRATE (PF) 100 MCG/2ML IJ SOLN
INTRAMUSCULAR | Status: AC
  Filled 2015-04-17: qty 2

## 2015-04-17 MED ORDER — LIDOCAINE-EPINEPHRINE 1 %-1:100000 IJ SOLN
INTRAMUSCULAR | Status: AC
  Filled 2015-04-17: qty 1

## 2015-04-17 MED ORDER — OXYCODONE-ACETAMINOPHEN 5-325 MG PO TABS: 2.0000 | ORAL_TABLET | Freq: Once | ORAL | Status: DC

## 2015-04-17 MED ORDER — FENTANYL CITRATE (PF) 100 MCG/2ML IJ SOLN
25.0000 ug | INTRAMUSCULAR | Status: DC | PRN
  Administered 2015-04-17: 25 ug via INTRAVENOUS

## 2015-04-17 MED ORDER — 0.9 % SODIUM CHLORIDE (POUR BTL) OPTIME
TOPICAL | Status: DC | PRN
  Administered 2015-04-17: 1000 mL

## 2015-04-17 MED ORDER — ROCURONIUM BROMIDE 100 MG/10ML IV SOLN
INTRAVENOUS | Status: DC | PRN
  Administered 2015-04-17: 25 mg via INTRAVENOUS

## 2015-04-17 MED ORDER — FENTANYL CITRATE (PF) 250 MCG/5ML IJ SOLN
INTRAMUSCULAR | Status: AC
  Filled 2015-04-17: qty 5

## 2015-04-17 MED ORDER — PROPOFOL 10 MG/ML IV BOLUS
INTRAVENOUS | Status: AC
  Filled 2015-04-17: qty 20

## 2015-04-17 MED ORDER — NEOSTIGMINE METHYLSULFATE 10 MG/10ML IV SOLN
INTRAVENOUS | Status: DC | PRN
  Administered 2015-04-17: 4 mg via INTRAVENOUS

## 2015-04-17 MED ORDER — BACIT-POLY-NEO HC 1 % EX OINT
TOPICAL_OINTMENT | CUTANEOUS | Status: AC
  Filled 2015-04-17: qty 15

## 2015-04-17 MED ORDER — LIDOCAINE HCL (CARDIAC) 20 MG/ML IV SOLN
INTRAVENOUS | Status: DC | PRN
  Administered 2015-04-17: 60 mg via INTRAVENOUS

## 2015-04-17 MED ORDER — MIDAZOLAM HCL 5 MG/5ML IJ SOLN
INTRAMUSCULAR | Status: DC | PRN
  Administered 2015-04-17: 2 mg via INTRAVENOUS

## 2015-04-17 MED ORDER — CEPHALEXIN 250 MG/5ML PO SUSR: 500.0000 mg | Freq: Three times a day (TID) | ORAL | Status: DC

## 2015-04-17 MED ORDER — SUCCINYLCHOLINE CHLORIDE 20 MG/ML IJ SOLN
INTRAMUSCULAR | Status: DC | PRN
  Administered 2015-04-17: 140 mg via INTRAVENOUS

## 2015-04-17 MED ORDER — GLYCOPYRROLATE 0.2 MG/ML IJ SOLN
INTRAMUSCULAR | Status: DC | PRN
  Administered 2015-04-17: 0.6 mg via INTRAVENOUS

## 2015-04-17 MED ORDER — OXYCODONE HCL 5 MG PO TABS: 5.0000 mg | ORAL_TABLET | Freq: Once | ORAL | Status: DC | PRN

## 2015-04-17 MED ORDER — FENTANYL CITRATE (PF) 100 MCG/2ML IJ SOLN
INTRAMUSCULAR | Status: DC | PRN
  Administered 2015-04-17 (×2): 50 ug via INTRAVENOUS
  Administered 2015-04-17: 150 ug via INTRAVENOUS

## 2015-04-17 MED ORDER — EPINEPHRINE HCL (NASAL) 0.1 % NA SOLN
1.0000 [drp] | Freq: Once | NASAL | Status: DC
  Filled 2015-04-17: qty 30

## 2015-04-17 MED ORDER — PROPOFOL 10 MG/ML IV BOLUS
INTRAVENOUS | Status: DC | PRN
  Administered 2015-04-17: 180 mg via INTRAVENOUS

## 2015-04-17 MED ORDER — OXYCODONE HCL 5 MG/5ML PO SOLN
ORAL | Status: AC
  Filled 2015-04-17: qty 5

## 2015-04-17 MED ORDER — HYDROMORPHONE HCL 1 MG/ML IJ SOLN: 1.0000 mg | Freq: Once | INTRAMUSCULAR | Status: DC

## 2015-04-17 MED ORDER — LACTATED RINGERS IV SOLN
INTRAVENOUS | Status: DC
  Administered 2015-04-17 (×2): via INTRAVENOUS

## 2015-04-17 MED ORDER — ONDANSETRON HCL 4 MG/2ML IJ SOLN: 4.0000 mg | Freq: Once | INTRAMUSCULAR | Status: DC | PRN

## 2015-04-17 MED ORDER — PHENYLEPHRINE HCL 0.5 % NA SOLN
NASAL | Status: DC | PRN
  Administered 2015-04-17: 2 [drp] via NASAL

## 2015-04-17 MED ORDER — KETOROLAC TROMETHAMINE 60 MG/2ML IM SOLN
60.0000 mg | Freq: Once | INTRAMUSCULAR | Status: AC
  Administered 2015-04-17: 60 mg via INTRAMUSCULAR
  Filled 2015-04-17: qty 2

## 2015-04-17 MED ORDER — OXYMETAZOLINE HCL 0.05 % NA SOLN
NASAL | Status: AC
  Filled 2015-04-17: qty 15

## 2015-04-17 MED ORDER — OXYCODONE HCL 5 MG/5ML PO SOLN: 5.0000 mg | Freq: Once | ORAL | Status: DC | PRN

## 2015-04-17 MED ORDER — HYDROMORPHONE HCL 1 MG/ML IJ SOLN
1.0000 mg | Freq: Once | INTRAMUSCULAR | Status: DC
  Administered 2015-04-17: 1 mg via INTRAVENOUS
  Filled 2015-04-17: qty 1

## 2015-04-17 MED ORDER — CLINDAMYCIN PHOSPHATE 600 MG/50ML IV SOLN
600.0000 mg | INTRAVENOUS | Status: AC
  Administered 2015-04-17: 600 mg via INTRAVENOUS
  Filled 2015-04-17: qty 50

## 2015-04-17 MED ORDER — HYDROMORPHONE HCL 1 MG/ML IJ SOLN
1.0000 mg | Freq: Once | INTRAMUSCULAR | Status: AC
  Administered 2015-04-17: 1 mg via INTRAMUSCULAR
  Filled 2015-04-17: qty 1

## 2015-04-17 SURGICAL SUPPLY — 40 items
BLADE 10 SAFETY STRL DISP (BLADE) ×3 IMPLANT
BLADE SURG 15 STRL LF DISP TIS (BLADE) IMPLANT
BLADE SURG 15 STRL SS (BLADE)
CANISTER SUCTION 2500CC (MISCELLANEOUS) ×3 IMPLANT
CLEANER TIP ELECTROSURG 2X2 (MISCELLANEOUS) ×3 IMPLANT
CONFORMERS SILICONE 5649 (OPHTHALMIC RELATED) IMPLANT
COVER SURGICAL LIGHT HANDLE (MISCELLANEOUS) ×6 IMPLANT
CRADLE DONUT ADULT HEAD (MISCELLANEOUS) IMPLANT
DECANTER SPIKE VIAL GLASS SM (MISCELLANEOUS) ×3 IMPLANT
DRAPE PROXIMA HALF (DRAPES) IMPLANT
ELECT COATED BLADE 2.86 ST (ELECTRODE) ×3 IMPLANT
ELECT NDL BLADE 2-5/6 (NEEDLE) IMPLANT
ELECT NEEDLE BLADE 2-5/6 (NEEDLE) IMPLANT
ELECT REM PT RETURN 9FT ADLT (ELECTROSURGICAL) ×3
ELECTRODE REM PT RTRN 9FT ADLT (ELECTROSURGICAL) ×1 IMPLANT
GLOVE ECLIPSE 7.5 STRL STRAW (GLOVE) ×3 IMPLANT
GOWN STRL REUS W/ TWL LRG LVL3 (GOWN DISPOSABLE) ×2 IMPLANT
GOWN STRL REUS W/TWL LRG LVL3 (GOWN DISPOSABLE) ×6
KIT BASIN OR (CUSTOM PROCEDURE TRAY) ×3 IMPLANT
KIT ROOM TURNOVER OR (KITS) ×3 IMPLANT
NDL HYPO 25GX1X1/2 BEV (NEEDLE) IMPLANT
NEEDLE HYPO 25GX1X1/2 BEV (NEEDLE) IMPLANT
NS IRRIG 1000ML POUR BTL (IV SOLUTION) ×3 IMPLANT
PAD ARMBOARD 7.5X6 YLW CONV (MISCELLANEOUS) ×6 IMPLANT
PATTIES SURGICAL .5 X3 (DISPOSABLE) IMPLANT
PENCIL FOOT CONTROL (ELECTRODE) ×3 IMPLANT
PLATE HYBRID GOLD MMF (Plate) ×4 IMPLANT
PLATE HYBRID MMF GOLD (Plate) IMPLANT
SCREW LOCK SELFDRIL 2.0X8M MMF (Screw) ×26 IMPLANT
SUT CHROMIC 3 0 PS 2 (SUTURE) ×3 IMPLANT
SUT CHROMIC 4 0 PS 2 18 (SUTURE) ×3 IMPLANT
SUT ETHILON 5 0 P 3 18 (SUTURE) ×2
SUT NYLON ETHILON 5-0 P-3 1X18 (SUTURE) ×1 IMPLANT
SUT SILK 2 0 FS (SUTURE) IMPLANT
SUT STEEL 0 (SUTURE) ×6
SUT STEEL 0 18XMFL TIE 17 (SUTURE) IMPLANT
SUT STEEL 4 (SUTURE) ×3 IMPLANT
TOWEL OR 17X24 6PK STRL BLUE (TOWEL DISPOSABLE) ×3 IMPLANT
TRAY ENT MC OR (CUSTOM PROCEDURE TRAY) ×3 IMPLANT
WATER STERILE IRR 1000ML POUR (IV SOLUTION) ×3 IMPLANT
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
## 2015-04-17 NOTE — ED Notes (Addendum)
Dr. Jearld Fenton called advising patient's surgery is set for 1100 today. Requesting patient to sign consent for surgery at this time.

## 2015-04-17 NOTE — ED Notes (Signed)
Pt requesting to speak to [employer] to file a report

## 2015-04-17 NOTE — Transfer of Care (Signed)
Immediate Anesthesia Transfer of Care Note  Patient: Bryan Ortega  Procedure(s) Performed: Procedure(s): OPEN REDUCTION INTERNAL FIXATION (ORIF) MANDIBULAR FRACTURE (N/A)  Patient Location: PACU  Anesthesia Type:General  Level of Consciousness: awake, alert  and confused  Airway & Oxygen Therapy: Patient connected to face mask oxygen  Post-op Assessment: Report given to RN  Post vital signs: stable  Last Vitals:  Filed Vitals:   04/17/15 1030  BP: 116/59  Pulse: 46  Temp:   Resp:     Complications: No apparent anesthesia complications
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
## 2015-04-17 NOTE — Discharge Instructions (Signed)
Facial Fracture A facial fracture is a break in one of the bones of your face. HOME CARE INSTRUCTIONS   Protect the injured part of your face until it is healed.  Do not participate in activities which give chance for re-injury until your doctor approves.  Gently wash and dry your face.  Wear head and facial protection while riding a bicycle, motorcycle, or snowmobile. SEEK MEDICAL CARE IF:   An oral temperature above 102 F (38.9 C) develops.  You have severe headaches or notice changes in your vision.  You have new numbness or tingling in your face.  You develop nausea (feeling sick to your stomach), vomiting or a stiff neck. SEEK IMMEDIATE MEDICAL CARE IF:   You develop difficulty seeing or experience double vision.  You become dizzy, lightheaded, or faint.  You develop trouble speaking, breathing, or swallowing.  You have a watery discharge from your nose or ear. MAKE SURE YOU:   Understand these instructions.  Will watch your condition.  Will get help right away if you are not doing well or get worse. Document Released: 08/06/2005 Document Revised: 10/29/2011 Document Reviewed: 03/25/2008 [facility] Patient Information 2015 [place]. This information is not intended to replace advice given to you by your health care provider. Make sure you discuss any questions you have with your health care provider.  Fractured-Jaw Meal Plan The purpose of the fractured-jaw meal plan is to provide foods that can be easily blended and easily swallowed. This plan is typically used after jaw or mouth surgery, wired jaw surgery, or dental surgery. Foods in this plan need to be blended so that they can be sipped from a straw or given through a syringe. You should try to have at least three meals and three snacks daily. It is important to make sure you get enough calories and protein to prevent weight loss and help your body heal, especially after surgery. You may wish to include a  liquid multivitamin in your plan to ensure that you get all the vitamins and minerals you need. Ask your health care provider for a recommendation.  HOW DO I PREPARE MY MEALS? All foods in this plan must be blended. Avoid nuts, seeds, skins, peels, bones, or any foods that cannot be blended to the right consistency. Make sure to eat a variety of foods from each food group every day. The following tips can help you as you blend your food:  Remove skins, seeds, and peels from food.  Cook meats and vegetables thoroughly.  Cut foods into small pieces and mix with a small amount of liquid in a food processor or blender. Continue to add liquid until the food becomes thin enough to sip through a straw.  Adding liquids such as juice, milk, cream, broth, gravy, or vegetable juice can help add flavor to foods.  Heat foods after they have been blended to reduce the amount of foam created from blending.  Heat or cool your foods to lukewarm temperatures if your teeth and mouth are sensitive to extreme temperatures. WHAT FOODS CAN I EAT? Make sure to eat a variety of foods from each food group.  Grains  Hot cereals, such as oatmeal, grits, ground wheat cereals, and polenta.  Rice and pasta.  Couscous. Vegetables  All cooked or canned vegetables, without seeds and skins.  Vegetable juices.  Cooked potatoes, without skins. Fruit  Any cooked or canned fruits, without seeds and skins.  Fresh, peeled soft fruits, such as bananas and peaches, that can  be blended until smooth.  All fruit juices, without seeds and skins. Meat and Other Protein Sources  Soft-boiled eggs, scrambled eggs, powdered eggs, pasteurized egg mixtures, and custard.  Ground meats, such as hamburger, [place], sausage, and meatloaf.  Tender, well-cooked meat, poultry, and fish prepared without bones or skin.  Soft soy foods (such as tofu).  Smooth nut butters. Dairy  All are allowed. Beverages  Coffee (regular or  decaffeinated), tea, and mineral water. Condiments  All seasonings and condiments that blend well. WHEN MAY I NEED TO SUPPLEMENT MY MEALS? If you begin to lose weight on this plan, you may need to increase the amount of food you are eating or the number of calories in your food or both. You can increase the number of calories by adding any of the following foods:  Protein powder or powdered milk.  Extra fats, such as margarine (without trans fat), sour cream, cream cheese, cream, and nut butters, such as peanut butter or almond butter.  Sweets, such as honey, ice cream, blackstrap molasses, or sugar. Document Released: 01/24/2010 Document Revised: 12/21/2013 Document Reviewed: 07/03/2013 [facility] Patient Information 2015 [place], [place]. This information is not intended to replace advice given to you by your health care provider. Make sure you discuss any questions you have with your health care provider.
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
## 2015-04-17 NOTE — Anesthesia Procedure Notes (Addendum)
Procedure Name: Intubation Date/Time: 04/17/2015 12:03 PM Performed by: Dorie Rank Pre-anesthesia Checklist: Patient identified, Emergency Drugs available, Suction available, Patient being monitored and Timeout performed Patient Re-evaluated:Patient Re-evaluated prior to inductionOxygen Delivery Method: Circle system utilized Preoxygenation: Pre-oxygenation with 100% oxygen Intubation Type: IV induction Ventilation: Mask ventilation without difficulty Laryngoscope Size: Mac and 3 Grade View: Grade I Nasal Tubes: Nasal Rae, Right, Magill forceps- large, utilized and Nasal prep performed Tube size: 7.0 mm Number of attempts: 1 Placement Confirmation: ETT inserted through vocal cords under direct vision,  positive ETCO2 and breath sounds checked- equal and bilateral Tube secured with: Tape

## 2015-04-17 NOTE — ED Notes (Signed)
Pt placed on 2L [place]

## 2015-04-17 NOTE — Anesthesia Preprocedure Evaluation (Addendum)
Anesthesia Evaluation  Patient identified by MRN, date of birth, ID band Patient awake    Reviewed: Allergy & Precautions, Patient's Chart, lab work & pertinent test results  Airway Mallampati: III     Mouth opening: Limited Mouth Opening  Dental  (+) Teeth Intact, Dental Advisory Given   Pulmonary Current Smoker,  breath sounds clear to auscultation        Cardiovascular negative cardio ROS  Rhythm:Regular Rate:Normal     Neuro/Psych negative neurological ROS     GI/Hepatic negative GI ROS, Neg liver ROS,   Endo/Other  negative endocrine ROS  Renal/GU negative Renal ROS  negative genitourinary   Musculoskeletal negative musculoskeletal ROS (+)   Abdominal (+)  Abdomen: soft. Bowel sounds: normal.  Peds negative pediatric ROS (+)  Hematology negative hematology ROS (+)   Anesthesia Other Findings   Reproductive/Obstetrics negative OB ROS                          Anesthesia Physical Anesthesia Plan  ASA: III  Anesthesia Plan: General   Post-op Pain Management:    Induction: Intravenous  Airway Management Planned: Nasal ETT  Additional Equipment:   Intra-op Plan:   Post-operative Plan: Extubation in OR  Informed Consent: I have reviewed the patients History and Physical, chart, labs and discussed the procedure including the risks, benefits and alternatives for the proposed anesthesia with the patient or authorized representative who has indicated his/her understanding and acceptance.   Dental advisory given  Plan Discussed with: CRNA and Anesthesiologist  Anesthesia Plan Comments:         Anesthesia Quick Evaluation

## 2015-04-17 NOTE — ED Notes (Signed)
[employer] at bedside 

## 2015-04-17 NOTE — ED Notes (Signed)
OR Called, requesting patient to be brought up between 1045-1050

## 2015-04-17 NOTE — Op Note (Signed)
Preop/postop diagnosis: Mandible fracture Procedure: Maxillary mandibular fixation Anesthesia: Gen. Estimated blood loss: Less than 5 mL Indications: 44 year old with a injury to his mandible fractured at the body and left subchondral/ramus. He has a nondisplaced fracture of the body. He was informed of the risks and benefits of the procedure and options were discussed. All his questions were answered and consent was obtained. Procedure: Patient was taken to the operating room placed in the supine position after general endotracheal nasal intubation he was draped in the usual sterile manner. The occlusion was examined. It look like he had a slight overbite based on the occlusion of the teeth. Palpation of the mandible did not reveal any step off to the fracture line that was in the site of no tooth on the left side. Arch bars were placed on the upper and lower region. His occlusion was then placed into what appeared to be his natural occlusion and then 4 wires were placed to 1 each side to fixate the mandible to the maxilla. It was a good fixation and the central incisors were perfectly lined up and his teeth look to be in position but he really did have poor dentition to realize exact occlusive surfaces. It was felt since the fracture in the left body was nondisplaced that it was unnecessary to open this up for a plate. He was suctioned out with a tracheal suction in his oropharynx. There was no bleeding. The wires were all tucked down inside the arch bar area. He was then awakened brought to recovery room in stable condition counts correct

## 2015-04-18 ENCOUNTER — Encounter (HOSPITAL_COMMUNITY): Payer: Self-pay | Admitting: Otolaryngology

## 2015-04-18 NOTE — Anesthesia Postprocedure Evaluation (Signed)
  Anesthesia Post-op Note  Patient: Bryan Ortega  Procedure(s) Performed: Procedure(s): OPEN REDUCTION INTERNAL FIXATION (ORIF) MANDIBULAR FRACTURE (N/A)  Patient Location: PACU  Anesthesia Type:General  Level of Consciousness: awake, alert  and oriented  Airway and Oxygen Therapy: Patient Spontanous Breathing and Patient connected to nasal cannula oxygen  Post-op Pain: mild  Post-op Assessment: Post-op Vital signs reviewed, Patient's Cardiovascular Status Stable, Respiratory Function Stable, Patent Airway and Pain level controlled              Post-op Vital Signs: stable  Last Vitals:  Filed Vitals:   04/17/15 1430  BP: 121/77  Pulse: 56  Temp: 36.6 C  Resp: 14    Complications: No apparent anesthesia complications

## 2015-05-24 ENCOUNTER — Other Ambulatory Visit: Payer: Self-pay | Admitting: Otolaryngology

## 2015-05-27 ENCOUNTER — Encounter (HOSPITAL_BASED_OUTPATIENT_CLINIC_OR_DEPARTMENT_OTHER): Payer: Self-pay | Admitting: *Deleted

## 2015-06-03 ENCOUNTER — Ambulatory Visit (HOSPITAL_BASED_OUTPATIENT_CLINIC_OR_DEPARTMENT_OTHER): Payer: BLUE CROSS/BLUE SHIELD | Admitting: Certified Registered"

## 2015-06-03 ENCOUNTER — Encounter (HOSPITAL_BASED_OUTPATIENT_CLINIC_OR_DEPARTMENT_OTHER)
Admission: RE | Disposition: A | Discharge status: Home / Self Care | Payer: Self-pay | Source: Ambulatory Visit | Attending: Otolaryngology

## 2015-06-03 ENCOUNTER — Ambulatory Visit (HOSPITAL_BASED_OUTPATIENT_CLINIC_OR_DEPARTMENT_OTHER)
Admission: RE | Admit: 2015-06-03 | Discharge: 2015-06-03 | Disposition: A | Discharge status: Home / Self Care | Payer: BLUE CROSS/BLUE SHIELD | Source: Ambulatory Visit | Attending: Otolaryngology | Admitting: Otolaryngology

## 2015-06-03 ENCOUNTER — Encounter (HOSPITAL_BASED_OUTPATIENT_CLINIC_OR_DEPARTMENT_OTHER): Payer: Self-pay | Admitting: *Deleted

## 2015-06-03 DIAGNOSIS — X58XXXD Exposure to other specified factors, subsequent encounter: Secondary | ICD-10-CM | POA: Diagnosis not present

## 2015-06-03 DIAGNOSIS — Z472 Encounter for removal of internal fixation device: Secondary | ICD-10-CM | POA: Insufficient documentation

## 2015-06-03 DIAGNOSIS — F1290 Cannabis use, unspecified, uncomplicated: Secondary | ICD-10-CM | POA: Insufficient documentation

## 2015-06-03 DIAGNOSIS — F17210 Nicotine dependence, cigarettes, uncomplicated: Secondary | ICD-10-CM | POA: Insufficient documentation

## 2015-06-03 DIAGNOSIS — S02609D Fracture of mandible, unspecified, subsequent encounter for fracture with routine healing: Secondary | ICD-10-CM | POA: Insufficient documentation

## 2015-06-03 HISTORY — PX: MANDIBULAR HARDWARE REMOVAL: SHX5205

## 2015-06-03 SURGERY — REMOVAL, HARDWARE, MANDIBLE
Anesthesia: General | Site: Mouth

## 2015-06-03 MED ORDER — DEXAMETHASONE SODIUM PHOSPHATE 10 MG/ML IJ SOLN
INTRAMUSCULAR | Status: AC
  Filled 2015-06-03: qty 1

## 2015-06-03 MED ORDER — FENTANYL CITRATE (PF) 100 MCG/2ML IJ SOLN
50.0000 ug | INTRAMUSCULAR | Status: DC | PRN
  Administered 2015-06-03: 50 ug via INTRAVENOUS
  Administered 2015-06-03: 100 ug via INTRAVENOUS

## 2015-06-03 MED ORDER — SCOPOLAMINE 1 MG/3DAYS TD PT72: 1.0000 | MEDICATED_PATCH | Freq: Once | TRANSDERMAL | Status: DC | PRN

## 2015-06-03 MED ORDER — BUPIVACAINE-EPINEPHRINE (PF) 0.25% -1:200000 IJ SOLN
INTRAMUSCULAR | Status: AC
  Filled 2015-06-03: qty 30

## 2015-06-03 MED ORDER — GLYCOPYRROLATE 0.2 MG/ML IJ SOLN: 0.2000 mg | Freq: Once | INTRAMUSCULAR | Status: DC | PRN

## 2015-06-03 MED ORDER — LIDOCAINE-EPINEPHRINE 1 %-1:100000 IJ SOLN
INTRAMUSCULAR | Status: AC
  Filled 2015-06-03: qty 1

## 2015-06-03 MED ORDER — METHYLENE BLUE 1 % INJ SOLN
INTRAMUSCULAR | Status: AC
  Filled 2015-06-03: qty 10

## 2015-06-03 MED ORDER — FENTANYL CITRATE (PF) 100 MCG/2ML IJ SOLN
INTRAMUSCULAR | Status: AC
  Filled 2015-06-03: qty 2

## 2015-06-03 MED ORDER — BACITRACIN ZINC 500 UNIT/GM EX OINT
TOPICAL_OINTMENT | CUTANEOUS | Status: AC
  Filled 2015-06-03: qty 28.35

## 2015-06-03 MED ORDER — PROPOFOL 500 MG/50ML IV EMUL
INTRAVENOUS | Status: AC
  Filled 2015-06-03: qty 50

## 2015-06-03 MED ORDER — SODIUM CHLORIDE 0.9 % IJ SOLN
INTRAMUSCULAR | Status: AC
  Filled 2015-06-03: qty 10

## 2015-06-03 MED ORDER — ONDANSETRON HCL 4 MG/2ML IJ SOLN
INTRAMUSCULAR | Status: DC | PRN
  Administered 2015-06-03: 4 mg via INTRAVENOUS

## 2015-06-03 MED ORDER — BUPIVACAINE-EPINEPHRINE (PF) 0.5% -1:200000 IJ SOLN
INTRAMUSCULAR | Status: AC
  Filled 2015-06-03: qty 30

## 2015-06-03 MED ORDER — LIDOCAINE HCL (CARDIAC) 20 MG/ML IV SOLN
INTRAVENOUS | Status: DC | PRN
  Administered 2015-06-03: 60 mg via INTRAVENOUS

## 2015-06-03 MED ORDER — PROPOFOL 10 MG/ML IV BOLUS
INTRAVENOUS | Status: DC | PRN
  Administered 2015-06-03 (×2): 20 mg via INTRAVENOUS
  Administered 2015-06-03: 150 mg via INTRAVENOUS
  Administered 2015-06-03: 50 mg via INTRAVENOUS

## 2015-06-03 MED ORDER — LACTATED RINGERS IV SOLN
INTRAVENOUS | Status: DC
  Administered 2015-06-03 (×2): via INTRAVENOUS

## 2015-06-03 MED ORDER — MIDAZOLAM HCL 2 MG/2ML IJ SOLN
1.0000 mg | INTRAMUSCULAR | Status: DC | PRN
  Administered 2015-06-03: 2 mg via INTRAVENOUS

## 2015-06-03 MED ORDER — FENTANYL CITRATE (PF) 100 MCG/2ML IJ SOLN
INTRAMUSCULAR | Status: AC
  Filled 2015-06-03: qty 4

## 2015-06-03 MED ORDER — ONDANSETRON HCL 4 MG/2ML IJ SOLN
INTRAMUSCULAR | Status: AC
  Filled 2015-06-03: qty 2

## 2015-06-03 MED ORDER — LIDOCAINE HCL (CARDIAC) 20 MG/ML IV SOLN
INTRAVENOUS | Status: AC
  Filled 2015-06-03: qty 5

## 2015-06-03 MED ORDER — MIDAZOLAM HCL 2 MG/2ML IJ SOLN
INTRAMUSCULAR | Status: AC
  Filled 2015-06-03: qty 4

## 2015-06-03 SURGICAL SUPPLY — 25 items
BLADE SURG 15 STRL LF DISP TIS (BLADE) ×1 IMPLANT
BLADE SURG 15 STRL SS (BLADE) ×3
CANISTER SUCT 1200ML W/VALVE (MISCELLANEOUS) ×3 IMPLANT
CORDS BIPOLAR (ELECTRODE) IMPLANT
COVER MAYO STAND STRL (DRAPES) ×3 IMPLANT
DECANTER SPIKE VIAL GLASS SM (MISCELLANEOUS) ×1 IMPLANT
GLOVE BIO SURGEON STRL SZ 6.5 (GLOVE) ×1 IMPLANT
GLOVE BIO SURGEONS STRL SZ 6.5 (GLOVE) ×1
GLOVE SS BIOGEL STRL SZ 7.5 (GLOVE) ×1 IMPLANT
GLOVE SUPERSENSE BIOGEL SZ 7.5 (GLOVE) ×2
GOWN STRL REUS W/ TWL LRG LVL3 (GOWN DISPOSABLE) ×1 IMPLANT
GOWN STRL REUS W/TWL LRG LVL3 (GOWN DISPOSABLE) ×6
MARKER SKIN DUAL TIP RULER LAB (MISCELLANEOUS) IMPLANT
NDL PRECISIONGLIDE 27X1.5 (NEEDLE) IMPLANT
NEEDLE PRECISIONGLIDE 27X1.5 (NEEDLE) IMPLANT
PACK BASIN DAY SURGERY FS (CUSTOM PROCEDURE TRAY) ×3 IMPLANT
SCISSORS WIRE ANG 4 3/4 DISP (INSTRUMENTS) ×2 IMPLANT
SHEET MEDIUM DRAPE 40X70 STRL (DRAPES) ×3 IMPLANT
SUT CHROMIC 3 0 PS 2 (SUTURE) IMPLANT
SUT CHROMIC 4 0 PS 2 18 (SUTURE) IMPLANT
SYR CONTROL 10ML LL (SYRINGE) IMPLANT
TOWEL OR 17X24 6PK STRL BLUE (TOWEL DISPOSABLE) ×6 IMPLANT
TUBE CONNECTING 20'X1/4 (TUBING) ×1
TUBE CONNECTING 20X1/4 (TUBING) ×2 IMPLANT
YANKAUER SUCT BULB TIP NO VENT (SUCTIONS) ×3 IMPLANT

## 2015-06-03 NOTE — Transfer of Care (Signed)
Immediate Anesthesia Transfer of Care Note  Patient: Bryan SeniorDouglas Koranda  Procedure(s) Performed: Procedure(s): REMOVAL ARCH BARS (N/A)  Patient Location: PACU  Anesthesia Type:General  Level of Consciousness: awake, alert , oriented and patient cooperative  Airway & Oxygen Therapy: Patient Spontanous Breathing and Patient connected to face mask oxygen  Post-op Assessment: Report given to RN and Post -op Vital signs reviewed and stable  Post vital signs: Reviewed and stable  Last Vitals:  Filed Vitals:   06/03/15 0802  BP:   Pulse: 97  Temp:   Resp: 24    Complications: No apparent anesthesia complications

## 2015-06-03 NOTE — Discharge Instructions (Addendum)
He may resume soft diet but would wait another week before chewing anything really difficult. Call if there's any increasing pain, swelling, or bleeding. Follow-up in about one-2 weeks sooner if any issues arise.    Post Anesthesia Home Care Instructions  Activity: Get plenty of rest for the remainder of the day. A responsible adult should stay with you for 24 hours following the procedure.  For the next 24 hours, DO NOT: -Drive a car -Advertising copywriterperate machinery -Drink alcoholic beverages -Take any medication unless instructed by your physician -Make any legal decisions or sign important papers.  Meals: Start with liquid foods such as gelatin or soup. Progress to regular foods as tolerated. Avoid greasy, spicy, heavy foods. If nausea and/or vomiting occur, drink only clear liquids until the nausea and/or vomiting subsides. Call your physician if vomiting continues.  Special Instructions/Symptoms: Your throat may feel dry or sore from the anesthesia or the breathing tube placed in your throat during surgery. If this causes discomfort, gargle with warm salt water. The discomfort should disappear within 24 hours.  If you had a scopolamine patch placed behind your ear for the management of post- operative nausea and/or vomiting:  1. The medication in the patch is effective for 72 hours, after which it should be removed.  Wrap patch in a tissue and discard in the trash. Wash hands thoroughly with soap and water. 2. You may remove the patch earlier than 72 hours if you experience unpleasant side effects which may include dry mouth, dizziness or visual disturbances. 3. Avoid touching the patch. Wash your hands with soap and water after contact with the patch.

## 2015-06-03 NOTE — Anesthesia Postprocedure Evaluation (Signed)
Anesthesia Post Note  Patient: Bryan Ortega  Procedure(s) Performed: Procedure(s) (LRB): REMOVAL ARCH BARS (N/A)  Anesthesia type: General  Patient location: PACU  Post pain: Pain level controlled and Adequate analgesia  Post assessment: Post-op Vital signs reviewed, Patient's Cardiovascular Status Stable, Respiratory Function Stable, Patent Airway and Pain level controlled  Last Vitals:  Filed Vitals:   06/03/15 0845  BP: 102/60  Pulse: 38  Temp:   Resp: 10    Post vital signs: Reviewed and stable  Level of consciousness: awake, alert  and oriented  Complications: No apparent anesthesia complications

## 2015-06-03 NOTE — Anesthesia Procedure Notes (Signed)
Date/Time: 06/03/2015 7:34 AM Performed by: Dejanay Wamboldt D Pre-anesthesia Checklist: Patient identified, Suction available, Emergency Drugs available, Patient being monitored and Timeout performed Patient Re-evaluated:Patient Re-evaluated prior to inductionOxygen Delivery Method: Circle system utilized Preoxygenation: Pre-oxygenation with 100% oxygen Intubation Type: Combination inhalational/ intravenous induction Ventilation: Mask ventilation without difficulty

## 2015-06-03 NOTE — H&P (Signed)
Bryan Ortega is an 44 y.o. male.   Chief Complaint: need hardware removed HPI: hx of mandible fx and now needs arch bars off  Past Medical History  Diagnosis Date  . Back pain     Past Surgical History  Procedure Laterality Date  . Lumbar laminectomy/decompression microdiscectomy  07/28/2011    Procedure: LUMBAR LAMINECTOMY/DECOMPRESSION MICRODISCECTOMY;  Surgeon: Hewitt Shortsobert W Nudelman;  Location: [facility] OR;  Service: Neurosurgery;  Laterality: Left;  Left lumbar five sacral one laminectomy and microdiskectomy  . Back surgery    . Orif mandibular fracture N/A 04/17/2015    Procedure: OPEN REDUCTION INTERNAL FIXATION (ORIF) MANDIBULAR FRACTURE;  Surgeon: Suzanna ObeyJohn Tavaras Goody, MD;  [facility];  Service: ENT;  Laterality: N/A;    History reviewed. No pertinent family history. Social History:  reports that he has been smoking Cigarettes.  He has a .4 pack-year smoking history. He has never used smokeless tobacco. He reports that he drinks alcohol. He reports that he uses illicit drugs (Marijuana).  Allergies: No Known Allergies  No prescriptions prior to admission    No results found for this or any previous visit (from the past 48 hour(s)). No results found.  Review of Systems  Constitutional: Negative.   HENT: Negative.   Eyes: Negative.   Respiratory: Negative.   Cardiovascular: Negative.   Musculoskeletal: Negative.   Skin: Negative.     Blood pressure 103/64, pulse 41, temperature 97.6 F (36.4 C), temperature source Oral, resp. rate 16, height 6\' 1"  (1.854 m), weight 64.184 kg (141 lb 8 oz), SpO2 100 %. Physical Exam  Constitutional: He appears well-developed and well-nourished.  HENT:  Head: Normocephalic and atraumatic.  Nose: Nose normal.  Eyes: Pupils are equal, round, and reactive to light.  Neck: Normal range of motion. Neck supple.  Cardiovascular: Normal rate.   Respiratory: Effort normal.  GI: Soft.  Musculoskeletal: Normal range of motion.      Assessment/Plan Mandible fx- discussed procedure of hardware removal  and ready to proceed  Suzanna ObeyBYERS, Bryan Ortega 06/03/2015, 7:23 AM

## 2015-06-03 NOTE — Anesthesia Preprocedure Evaluation (Addendum)
Anesthesia Evaluation  Patient identified by MRN, date of birth, ID band Patient awake    Reviewed: Allergy & Precautions, NPO status , Patient's Chart, lab work & pertinent test results  Airway Mallampati: III   Neck ROM: full  Mouth opening: Limited Mouth Opening  Dental   Pulmonary Current Smoker,    breath sounds clear to auscultation       Cardiovascular negative cardio ROS   Rhythm:regular Rate:Normal     Neuro/Psych    GI/Hepatic   Endo/Other    Renal/GU      Musculoskeletal   Abdominal   Peds  Hematology   Anesthesia Other Findings   Reproductive/Obstetrics                            Anesthesia Physical Anesthesia Plan  ASA: II  Anesthesia Plan: General   Post-op Pain Management:    Induction: Intravenous and Inhalational  Airway Management Planned: Mask  Additional Equipment:   Intra-op Plan:   Post-operative Plan:   Informed Consent: I have reviewed the patients History and Physical, chart, labs and discussed the procedure including the risks, benefits and alternatives for the proposed anesthesia with the patient or authorized representative who has indicated his/her understanding and acceptance.     Plan Discussed with: CRNA, Anesthesiologist and Surgeon  Anesthesia Plan Comments:        Anesthesia Quick Evaluation

## 2015-06-03 NOTE — Op Note (Signed)
Preop/postop diagnosis: Mandible fracture Procedure: Removal of hardware Anesthesia: Gen. Estimated blood loss: Approximately 20 mL Indications: Patient is a 44 year old with a previous mandible fracture approximately 6 weeks ago. He now has arch bars that need to be removed. He's not having any problems or pain. His occlusion seems to be excellent. He was informed risks and benefits of the procedure and options were discussed all questions were answered and consent was obtained. Procedure: Patient was taken to the operating room placed in the supine position after general mask anesthesia the arch bars were examined and multiple screws were covered over with mucosa that were dissected with the hemostat. The screws were removed from both the upper and lower alveolus. There was good hemostasis. The pharynx was suctioned out of all blood and debris. His mouth opened very well so no trismus. He was then awakened brought to recovery room in stable condition counts correct

## 2015-06-06 ENCOUNTER — Encounter (HOSPITAL_BASED_OUTPATIENT_CLINIC_OR_DEPARTMENT_OTHER): Payer: Self-pay | Admitting: Otolaryngology

## 2015-10-12 ENCOUNTER — Telehealth: Payer: Self-pay | Admitting: *Deleted

## 2015-10-12 NOTE — Telephone Encounter (Signed)
Unable to reach patient at time of pre-visit call. Left message for patient to return call when available.  

## 2015-10-13 ENCOUNTER — Ambulatory Visit: Payer: BLUE CROSS/BLUE SHIELD | Admitting: Family Medicine

## 2015-10-13 ENCOUNTER — Telehealth: Payer: Self-pay | Admitting: Family Medicine

## 2015-10-17 NOTE — Telephone Encounter (Signed)
Pt was no show 10/13/15 4:15pm for new pt appt, pt has rescheduled for 10/24/15, charge or no charge?

## 2015-10-18 NOTE — Telephone Encounter (Signed)
-----   Message from Pearline Cables, MD sent at 10/17/2015  5:37 PM EST ----- We can no charge this time

## 2015-10-21 ENCOUNTER — Telehealth: Payer: Self-pay | Admitting: *Deleted

## 2015-10-21 NOTE — Telephone Encounter (Signed)
Unable to reach patient at time of pre-visit call. Left message for patient to return call when available.  

## 2015-10-24 ENCOUNTER — Telehealth: Payer: Self-pay | Admitting: Family Medicine

## 2015-10-24 ENCOUNTER — Ambulatory Visit: Payer: BLUE CROSS/BLUE SHIELD | Admitting: Family Medicine

## 2015-10-25 ENCOUNTER — Encounter: Payer: Self-pay | Admitting: Family Medicine

## 2015-10-25 NOTE — Telephone Encounter (Signed)
Marked to charge and mailing no show letter °

## 2015-10-25 NOTE — Telephone Encounter (Signed)
Pt was no show 10/24/15 1:15pm for new pt appt. This is pts 2nd no show for new pt appt with you. Charge or no charge? Allow rescheduling with you?

## 2015-10-25 NOTE — Telephone Encounter (Signed)
-----   Message from Pearline CablesJessica C Copland, MD sent at 10/25/2015  2:13 PM EST ----- Yes charge please.  We can allow him to schedule one more time

## 2016-08-26 IMAGING — CT CT MAXILLOFACIAL W/O CM
3 of 5 series · 16 of 47 positions shown, 19 images · non-contrast
Comparison: None.

CLINICAL DATA: Left-sided jaw pain and swelling after injury.

EXAM:
CT MAXILLOFACIAL WITHOUT CONTRAST
TECHNIQUE: Multidetector CT imaging of the maxillofacial structures was
performed. Multiplanar CT image reconstructions were also generated.
A small metallic BB was placed on the right temple in order to
reliably differentiate right from left.

[Series 3: facial/ orbits 2.0 h30s · axial · 0.34mm/px · z∈[-238,-88]mm · 11 of 89 slices shown, 14 images]
[im 7/89  brain]
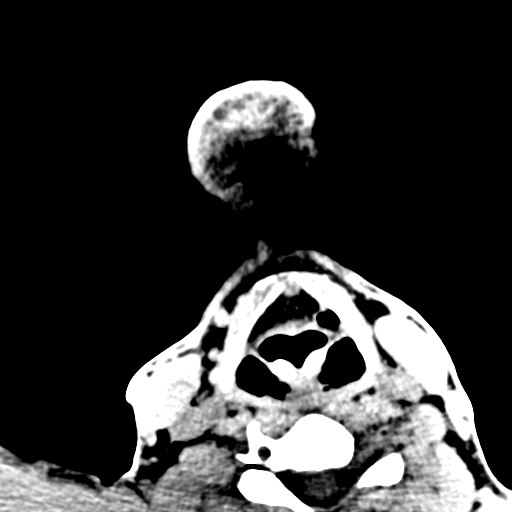
[im 7/89  bone]
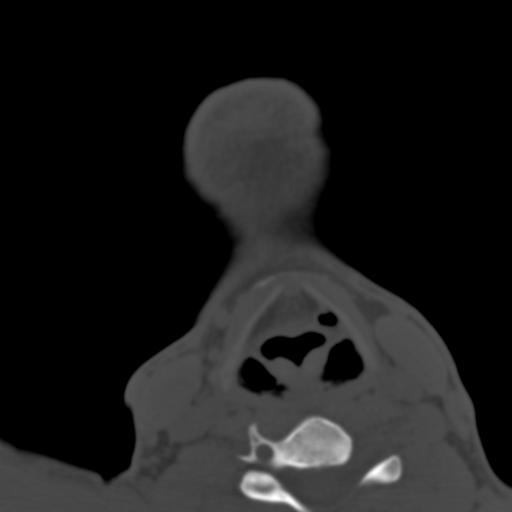
[im 13/89  bone]
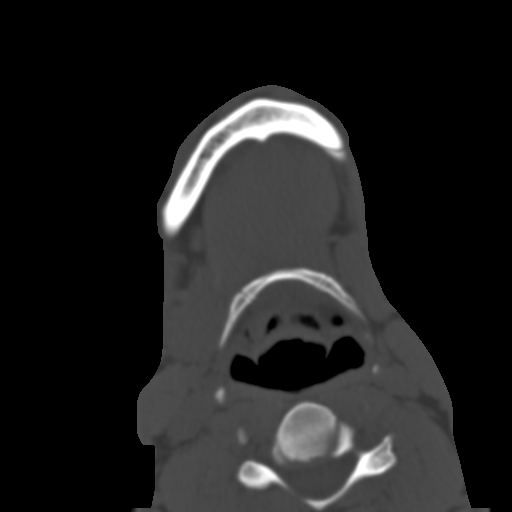
[im 22/89  bone]
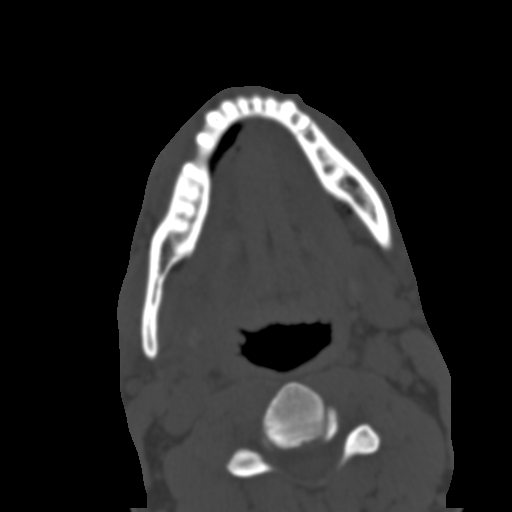
[im 28/89  bone]
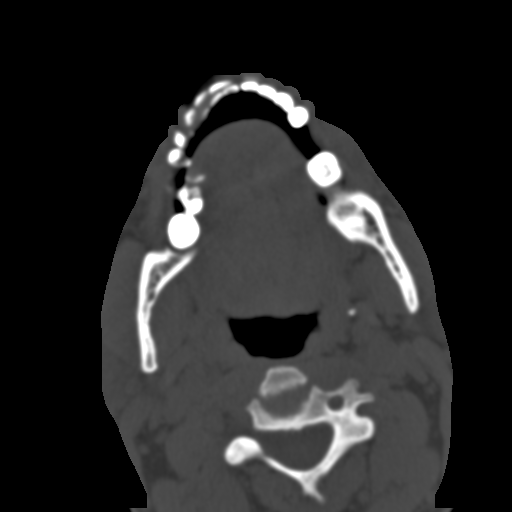
[im 37/89  brain]
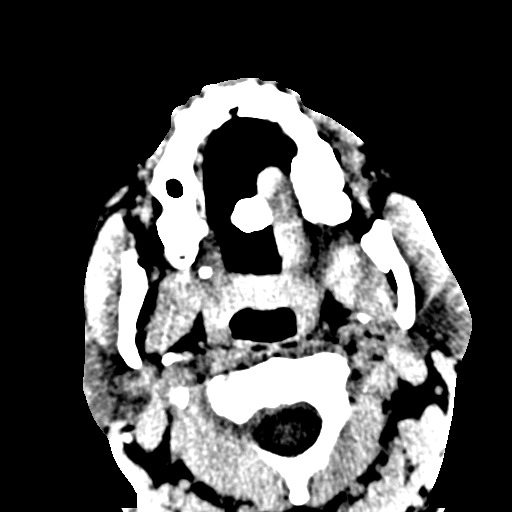
[im 37/89  bone]
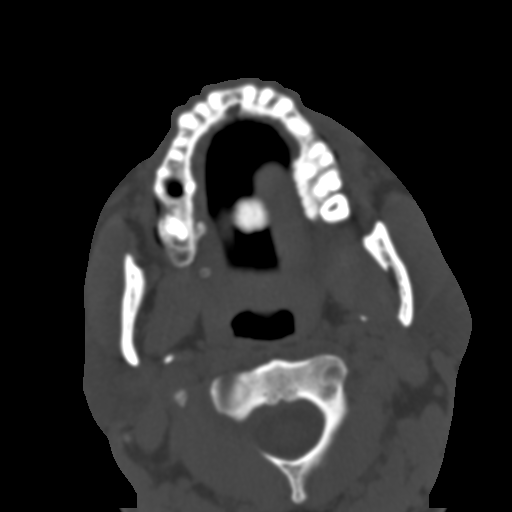
[im 46/89  bone]
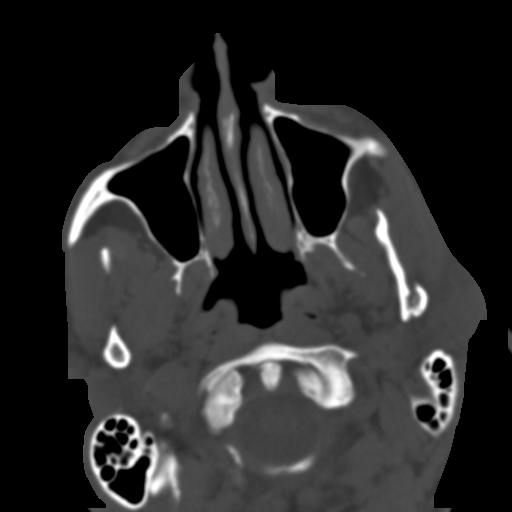
[im 52/89  bone]
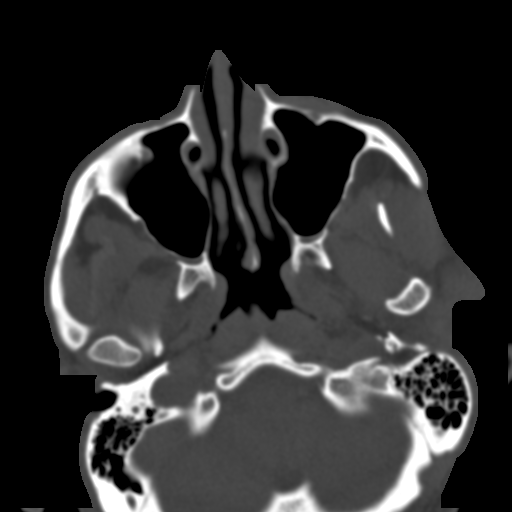
[im 61/89  bone]
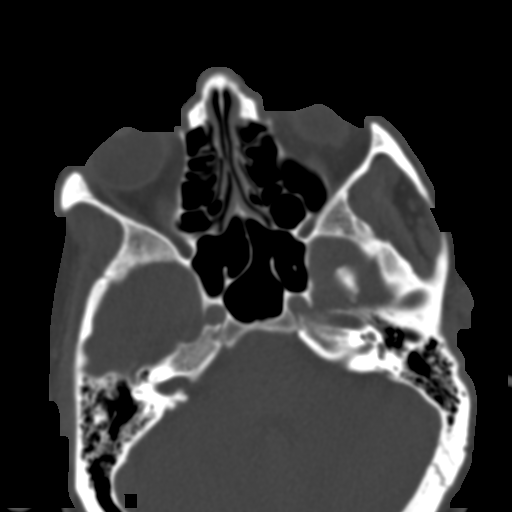
[im 67/89  brain]
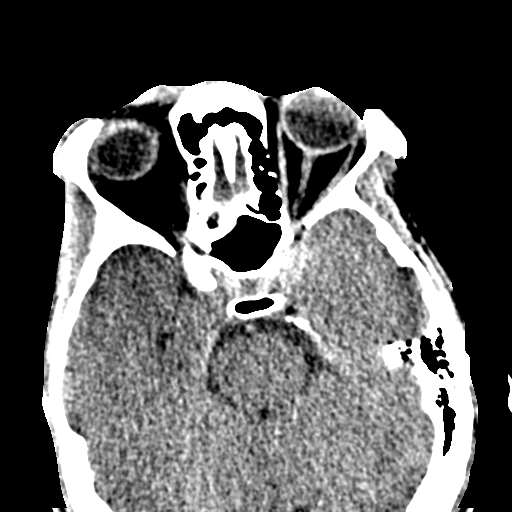
[im 67/89  bone]
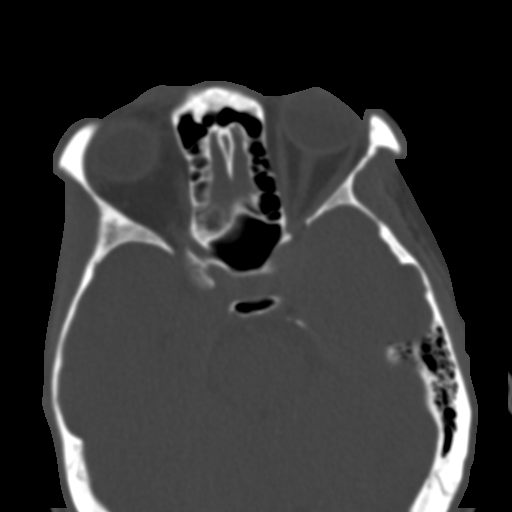
[im 76/89  bone]
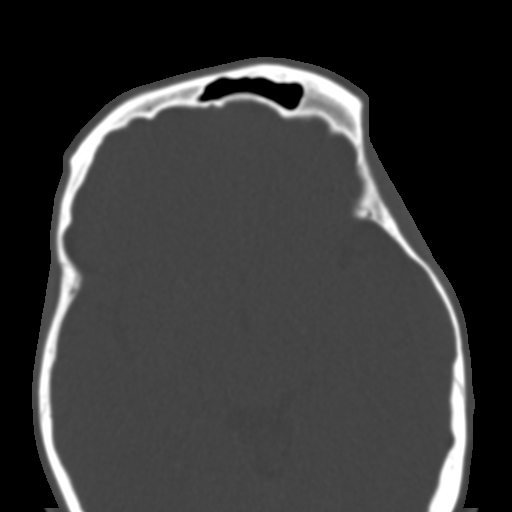
[im 82/89  bone]
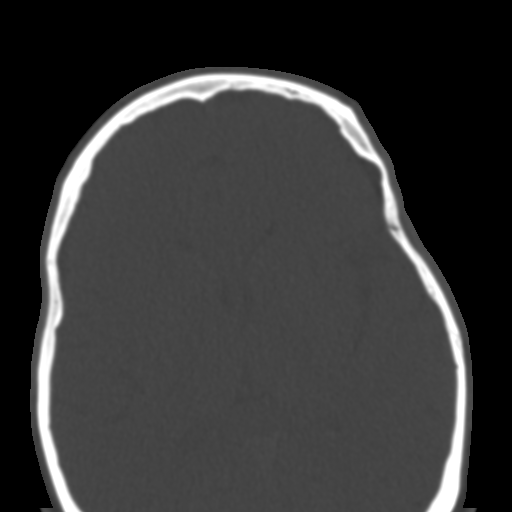

[Series 11: coronal bone · coronal · 0.35mm/px · 3 of 94 slices shown]
[im 24/94  bone]
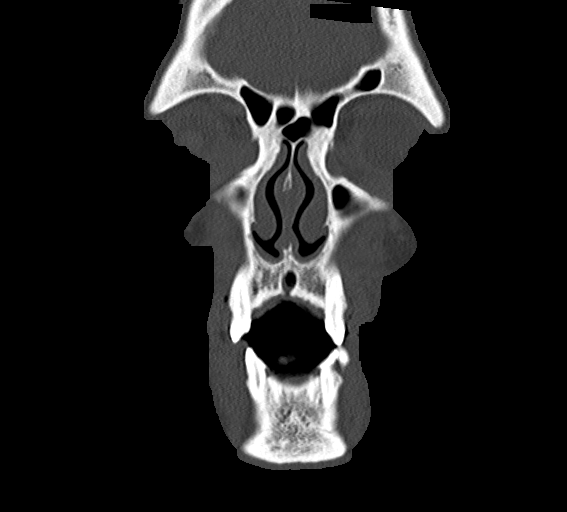
[im 47/94  bone]
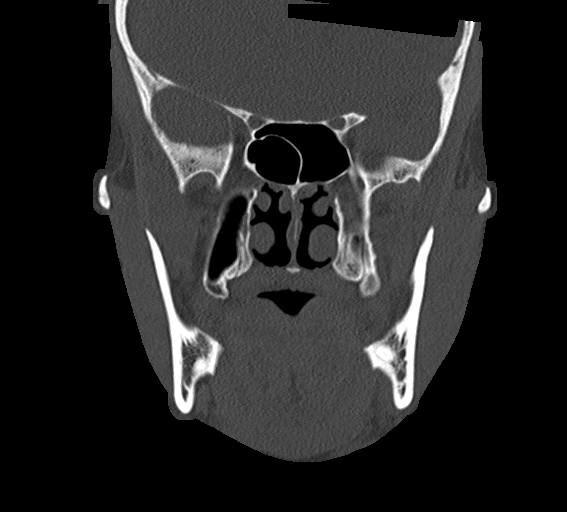
[im 70/94  bone]
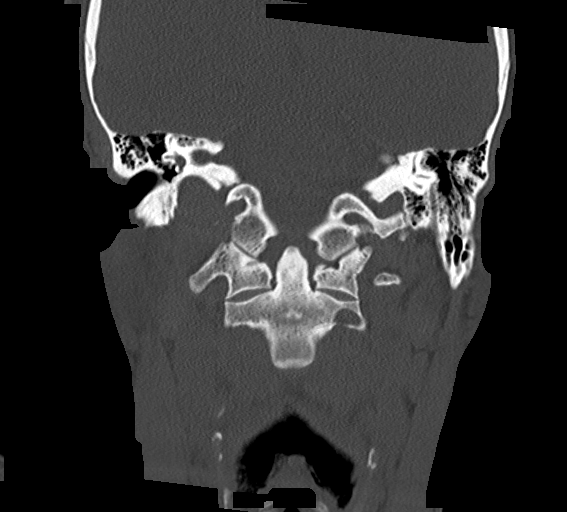

[Series 12: sagittal bone · sagittal · 0.35mm/px · 2 of 84 slices shown]
[im 28/84  bone]
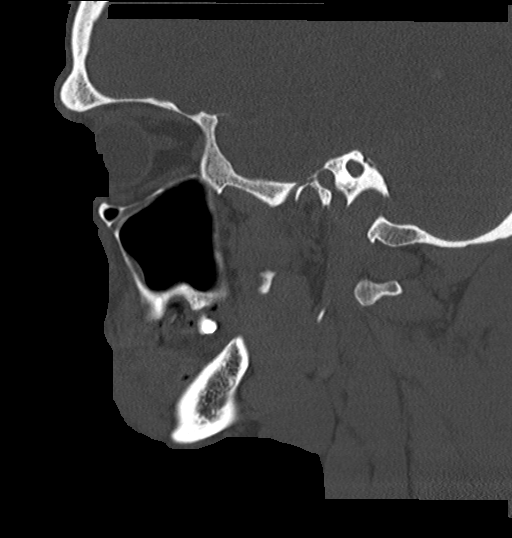
[im 56/84  bone]
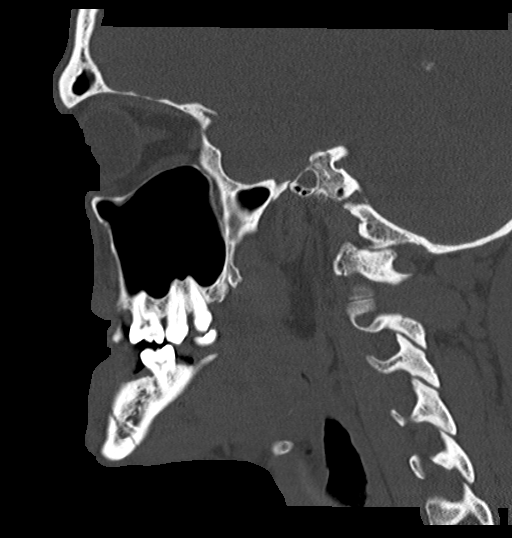

[16 of 47 positions shown; findings below may reference images not displayed]

FINDINGS: Mildly comminuted nondisplaced fracture of the left aspect of the
mandible extending to the lower first bicuspid. Displaced fracture
of the left mandibular ramus, no extension to the temporomandibular
joint. There is associated soft tissue edema adjacent to the
fracture sites. No additional facial bone fractures. The orbits and
globes are intact. The paranasal sinuses are well-aerated.
IMPRESSION: Comminuted left mandibular fracture, with nondisplaced involvement
of the left anterior aspect and mildly displaced ramus component.

## 2018-04-09 ENCOUNTER — Emergency Department (HOSPITAL_COMMUNITY): Payer: No Typology Code available for payment source

## 2018-04-09 ENCOUNTER — Encounter (HOSPITAL_COMMUNITY): Payer: Self-pay | Admitting: *Deleted

## 2018-04-09 ENCOUNTER — Emergency Department (HOSPITAL_COMMUNITY)
Admission: EM | Admit: 2018-04-09 | Discharge: 2018-04-09 | Disposition: A | Discharge status: Home / Self Care | Payer: No Typology Code available for payment source | Attending: Emergency Medicine | Admitting: Emergency Medicine

## 2018-04-09 DIAGNOSIS — R0789 Other chest pain: Secondary | ICD-10-CM | POA: Diagnosis not present

## 2018-04-09 DIAGNOSIS — M25511 Pain in right shoulder: Secondary | ICD-10-CM | POA: Diagnosis not present

## 2018-04-09 DIAGNOSIS — F17210 Nicotine dependence, cigarettes, uncomplicated: Secondary | ICD-10-CM | POA: Diagnosis not present

## 2018-04-09 DIAGNOSIS — R9431 Abnormal electrocardiogram [ECG] [EKG]: Secondary | ICD-10-CM

## 2018-04-09 LAB — CBC
HCT: 42.9 % (ref 39.0–52.0)
HEMOGLOBIN: 14.4 g/dL (ref 13.0–17.0)
MCH: 30.6 pg (ref 26.0–34.0)
MCHC: 33.6 g/dL (ref 30.0–36.0)
MCV: 91.1 fL (ref 78.0–100.0)
Platelets: 198 10*3/uL (ref 150–400)
RBC: 4.71 MIL/uL (ref 4.22–5.81)
RDW: 12.7 % (ref 11.5–15.5)
WBC: 4.9 10*3/uL (ref 4.0–10.5)

## 2018-04-09 LAB — I-STAT TROPONIN, ED
TROPONIN I, POC: 0 ng/mL (ref 0.00–0.08)
TROPONIN I, POC: 0 ng/mL (ref 0.00–0.08)

## 2018-04-09 LAB — BASIC METABOLIC PANEL
ANION GAP: 8 (ref 5–15)
BUN: 14 mg/dL (ref 6–20)
CALCIUM: 9.7 mg/dL (ref 8.9–10.3)
CHLORIDE: 107 mmol/L (ref 98–111)
CO2: 25 mmol/L (ref 22–32)
Creatinine, Ser: 0.87 mg/dL (ref 0.61–1.24)
GFR calc non Af Amer: >60 mL/min (ref >60)
Glucose, Bld: 123 mg/dL — ABNORMAL HIGH (ref 70–99)
Potassium: 4 mmol/L (ref 3.5–5.1)
SODIUM: 140 mmol/L (ref 135–145)

## 2018-04-09 MED ORDER — NAPROXEN 500 MG PO TABS: 500.0000 mg | ORAL_TABLET | Freq: Two times a day (BID) | ORAL | 0 refills | Status: DC

## 2018-04-09 MED ORDER — METHOCARBAMOL 500 MG PO TABS
750.0000 mg | ORAL_TABLET | Freq: Once | ORAL | Status: AC
  Administered 2018-04-09: 750 mg via ORAL
  Filled 2018-04-09: qty 2

## 2018-04-09 MED ORDER — ACETAMINOPHEN 325 MG PO TABS
650.0000 mg | ORAL_TABLET | Freq: Once | ORAL | Status: AC
  Administered 2018-04-09: 650 mg via ORAL
  Filled 2018-04-09: qty 2

## 2018-04-09 MED ORDER — METHOCARBAMOL 500 MG PO TABS: 500.0000 mg | ORAL_TABLET | Freq: Two times a day (BID) | ORAL | 0 refills | Status: DC

## 2018-04-09 NOTE — ED Provider Notes (Signed)
[facility] Provider Note   CSN: [id number] Arrival date & time: 04/09/18  [id number]     History   Chief Complaint Chief Complaint  Patient presents with  . Chest Pain    HPI Bryan Ortega is a 47 y.o. male with no significant past medical history presents emergency department today for right shoulder and right upper chest pain.  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  He reports that his pain has been constant since onset and he describes as a achy sensation.  There is no associated nausea, emesis, diaphoresis, shortness of breath.  He reports his pain is present at rest and is worse with movement of the right shoulder.  He denies any exertional chest pain.  He denies any fever or cough.  Pain does not radiate to his neck, jaw, back, abdomen.  Patient denies any chest or shoulder trauma.  He denies any numbness/tingling/weakness of the extremities.  No neck pain or visual changes.  Patient denies personal history of MI, CVA, TIA, hypertension, hyperlipidemia, diabetes or obesity.  Patient denies family history of early cardiac disease.  Patient is a daily smoker. Denies risk factors for PE including exogenous estrogen use, recent surgery or travel, trauma, immobilization, smoking, previous blood clot, cough, hemoptysis, cancer, lower extremity pain or swelling.    HPI  Past Medical History:  Diagnosis Date  . Back pain     Patient Active Problem List   Diagnosis Date Noted  . Back pain     Past Surgical History:  Procedure Laterality Date  . BACK SURGERY    . LUMBAR LAMINECTOMY/DECOMPRESSION MICRODISCECTOMY  07/28/2011   Procedure: LUMBAR LAMINECTOMY/DECOMPRESSION MICRODISCECTOMY;  Surgeon: Hewitt Shortsobert W Nudelman;  Location: [facility] OR;  Service: Neurosurgery;  Laterality: Left;  Left lumbar five sacral one laminectomy and microdiskectomy  . MANDIBULAR HARDWARE REMOVAL N/A  06/03/2015   Procedure: REMOVAL ARCH BARS;  Surgeon: Suzanna ObeyJohn Byers, MD;  Location: [facility];  Service: ENT;  Laterality: N/A;  . ORIF MANDIBULAR FRACTURE N/A 04/17/2015   Procedure: OPEN REDUCTION INTERNAL FIXATION (ORIF) MANDIBULAR FRACTURE;  Surgeon: Suzanna ObeyJohn Byers, MD;  [facility];  Service: ENT;  Laterality: N/A;        Home Medications    Prior to Admission medications   Not on File    Family History History reviewed. No pertinent family history.  Social History Social History   Tobacco Use  . Smoking status: Current Every Day Smoker    Packs/day: 0.02    Years: 20.00    Pack years: 0.40    Types: Cigarettes  . Smokeless tobacco: Never Used  Substance Use Topics  . Alcohol use: Yes    Comment: Drinks a beer daily  . Drug use: Yes    Types: Marijuana    Comment: LAst smoke marijuana on 05/25/2015     Allergies   Patient has no known allergies.   Review of Systems Review of Systems  All other systems reviewed and are negative.    Physical Exam Updated Vital Signs BP 111/79   Pulse (!) 56   Temp 97.9 F (36.6 C)   Resp 18   SpO2 100%   Physical Exam  Constitutional: He appears well-developed and well-nourished.  HENT:  Head: Normocephalic and atraumatic.  Right Ear: External ear normal.  Left Ear: External ear normal.  Nose: Nose normal.  Mouth/Throat: Uvula is midline, oropharynx is  clear and moist and mucous membranes are normal. No tonsillar exudate.  Eyes: Pupils are equal, round, and reactive to light. Right eye exhibits no discharge. Left eye exhibits no discharge. No scleral icterus.  Neck: Trachea normal. Neck supple. No spinous process tenderness present. No neck rigidity. Normal range of motion present.  Cardiovascular: Normal rate, regular rhythm and intact distal pulses.  No murmur heard. Pulses:      Radial pulses are 2+ on the right side, and 2+ on the left side.       Dorsalis pedis pulses are 2+ on the right side,  and 2+ on the left side.       Posterior tibial pulses are 2+ on the right side, and 2+ on the left side.  No lower extremity swelling or edema. Calves symmetric in size bilaterally.  Pulmonary/Chest: Effort normal and breath sounds normal. He exhibits tenderness.    Abdominal: Soft. Bowel sounds are normal. There is no tenderness. There is no rebound and no guarding.  Musculoskeletal: He exhibits no edema.       Right shoulder: He exhibits bony tenderness.       Right elbow: Normal.      Cervical back: Normal.  Patient reports pain in right upper chest and right anterior shoulder with maximal flexion, external rotation, and abduction of the right shoulder.  There is tenderness of the right upper chest as well as the right shoulder. Appearance normal. No obvious bony deformity. No skin swelling, erythema, heat, fluctuance or break of the skin. No clavicular deformity or TTP. Strength appropriate for age. Negative Hawkin's test. Negative Neer's test. Negative Adson's test.  Radial Pulse 2+. Cap refill <2 seconds. SILT for M/U/R distributions. Compartments soft.   Lymphadenopathy:    He has no cervical adenopathy.  Neurological: He is alert.  Speech clear. Follows commands. No facial droop. PERRLA. EOM grossly intact. CN III-XII grossly intact. Grossly moves all extremities 4 without ataxia. Able and appropriate strength for age to upper and lower extremities bilaterally  Skin: Skin is warm and dry. No rash noted. Rash is not vesicular. He is not diaphoretic.  Psychiatric: He has a normal mood and affect.  Nursing note and vitals reviewed.    ED Treatments / Results  Labs (all labs ordered are listed, but only abnormal results are displayed) Labs Reviewed  BASIC METABOLIC PANEL - Abnormal; Notable for the following components:      Result Value   Glucose, Bld 123 (*)    All other components within normal limits  CBC  I-STAT TROPONIN, ED  I-STAT TROPONIN, ED     EKG None  Radiology Dg Chest 2 View  Result Date: 04/09/2018 CLINICAL DATA:  Anterior right chest and right shoulder discomfort began yesterday. No known injury. EXAM: CHEST - 2 VIEW COMPARISON:  Chest x-ray of February 05, 2012 FINDINGS: The lungs are well-expanded and clear. The heart and pulmonary vascularity are normal. The mediastinum is normal in width. There is no pleural effusion. The bony thorax exhibits no acute abnormality. The observed portions of the upper abdomen are normal. IMPRESSION: There is no active cardiopulmonary disease. Electronically Signed   By: David  [place] M.D.   On: 04/09/2018 10:40   Dg Shoulder Right  Result Date: 04/09/2018 CLINICAL DATA:  Anterior right chest and right shoulder region discomfort began yesterday. No known injury. EXAM: RIGHT SHOULDER - 2+ VIEW COMPARISON:  Chest x-ray of today's date FINDINGS: The bones are subjectively adequately mineralized. The glenohumeral and AC joints  are grossly normal. No are no acute or healing fracture is observed. No lytic or blastic lesions are demonstrated. The observed portions of the right clavicle and upper right ribs are normal. IMPRESSION: There is no acute or significant chronic bony abnormality of the right shoulder Electronically Signed   By: David  [place]and[place] M.D.   On: 04/09/2018 10:41    Procedures Procedures (including critical care time)  Medications Ordered in ED Medications  acetaminophen (TYLENOL) tablet 650 mg (650 mg Oral Given 04/09/18 1516)  methocarbamol (ROBAXIN) tablet 750 mg (750 mg Oral Given 04/09/18 1516)     Initial Impression / Assessment and Plan / ED Course  I have reviewed the triage vital signs and the nursing notes.  Pertinent labs & imaging results that were available during my care of the patient were reviewed by me and considered in my medical decision making (see chart for details).     47 year old male presented to the ED with right shoulder pain and right chest pain after  moving boxes at work.  Patient with pain that is exacerbated with movement of his right shoulder.  He has tenderness palpation of the chest wall as well as the right shoulder.  He denies exertional symptoms.  No associated nausea, emesis, diaphoresis, shortness of breath.  Patient is to be discharged with recommendation to follow up with PCP in regards to today's hospital visit. Chest pain is not likely of cardiac or pulmonary etiology due to presentation, perc negative, stable vital signs, no tracheal deviation, no JVD or new murmur, RRR, breath sounds equal bilaterally, negative troponin x 2, and negative CXR.  EKG is noted to have first-degree AV block.  No other abnormalities noted.  No evidence of second or third-degree AV block. No STEMI. HEART score is 2 which places the patient at low risk.  Patient given Tylenol and muscle relaxants department with relief of his symptoms. Patient has been advised to return to the ED if chest pain becomes exertional, associated with diaphoresis or nausea, radiates to left jaw/arm, worsens or becomes concerning in any way. Patient appears reliable for follow up and is agreeable to discharge. I advised the patient to follow-up with their primary care provider this week. I advised the patient to return to the emergency department with new or worsening symptoms or new concerns. The patient verbalized understanding and agreement with plan. Patient stable for discharge home and in agreement with plan.   Final Clinical Impressions(s) / ED Diagnoses   Final diagnoses:  Atypical chest pain  Acute pain of right shoulder  Abnormal EKG    ED Discharge Orders    None       Princella PellegriniMaczis, Louvenia Golomb M, PA-C 04/09/18 2009    Pricilla LovelessGoldston, Scott, MD 04/09/18 2030

## 2018-04-09 NOTE — Discharge Instructions (Signed)
Read instructions below for reasons to return to the Emergency Department. It is recommended that your follow up with your Primary Care Doctor in regards to today's visit. If you do not have a doctor, use the resource guide listed below to help you find one.   Tests performed today include: An EKG of your heart A chest x-ray and right shoulder xray Cardiac enzymes - a blood test for heart muscle damage Blood counts and electrolytes Vital signs. See below for your results today.   Chest Pain (Nonspecific)  HOME CARE INSTRUCTIONS  For the next few days, avoid physical activities that bring on chest pain. Continue physical activities as directed.  Do not smoke cigarettes or drink alcohol until your symptoms are gone. If you do smoke, it is time to quit. You may receive instructions and counseling on how to stop smoking. Only take over-the-counter or prescription medicine for pain, discomfort, or fever as directed by your caregiver.  Follow your caregiver's suggestions for further testing if your chest pain does not go away.  Keep any follow-up appointments you made. If you do not go to an appointment, you could develop lasting (chronic) problems with pain. If there is any problem keeping an appointment, you must call to reschedule.  SEEK MEDICAL CARE IF:  You think you are having problems from the medicine you are taking. Read your medicine instructions carefully.  Your chest pain does not go away, even after treatment.  You develop a rash with blisters on your chest.  SEEK IMMEDIATE MEDICAL CARE IF:  You have increased chest pain or pain that spreads to your arm, neck, jaw, back, or belly (abdomen).  You develop shortness of breath, an increasing cough, or you are coughing up blood.  You have severe back or abdominal pain, feel sick to your stomach (nauseous) or throw up (vomit).  You develop severe weakness, fainting, or chills.  You have an oral temperature above 102 F (38.9 C), not  controlled by medicine.  THIS IS AN EMERGENCY. Do not wait to see if the pain will go away. Get medical help at once. Call your local emergency services (911 in [place] Do not drive yourself to the hospital. Additional Information:  Please follow up with cardiology in regards to your EKG changes. See attached handouts.   Your vital signs today were: BP (!) 136/95    Pulse (!) 46    Temp 97.9 F (36.6 C)    Resp 17    SpO2 100%  If your blood pressure (BP) was elevated above 135/85 this visit, please have this repeated by your doctor within one month. ---------------

## 2018-04-09 NOTE — ED Notes (Signed)
Asked Tech to get another EKG due to changes in chest pain and heart rate.

## 2018-04-09 NOTE — ED Triage Notes (Signed)
Pt in c/o right sided chest pain and right shoulder pain, pt is unsure if he injured his shoulder at work but pain has persisted, pain is worse with movement

## 2020-10-28 ENCOUNTER — Emergency Department (HOSPITAL_COMMUNITY): Payer: BC Managed Care – PPO

## 2020-10-28 ENCOUNTER — Encounter (HOSPITAL_COMMUNITY): Payer: Self-pay

## 2020-10-28 ENCOUNTER — Emergency Department (HOSPITAL_COMMUNITY)
Admission: EM | Admit: 2020-10-28 | Discharge: 2020-10-28 | Disposition: A | Discharge status: Home / Self Care | Payer: BC Managed Care – PPO | Attending: Emergency Medicine | Admitting: Emergency Medicine

## 2020-10-28 ENCOUNTER — Other Ambulatory Visit: Payer: Self-pay

## 2020-10-28 DIAGNOSIS — M546 Pain in thoracic spine: Secondary | ICD-10-CM | POA: Insufficient documentation

## 2020-10-28 DIAGNOSIS — R0789 Other chest pain: Secondary | ICD-10-CM | POA: Insufficient documentation

## 2020-10-28 DIAGNOSIS — F17210 Nicotine dependence, cigarettes, uncomplicated: Secondary | ICD-10-CM | POA: Insufficient documentation

## 2020-10-28 LAB — URINALYSIS, ROUTINE W REFLEX MICROSCOPIC
Bilirubin Urine: NEGATIVE
Glucose, UA: NEGATIVE mg/dL
Hgb urine dipstick: NEGATIVE
Ketones, ur: NEGATIVE mg/dL
Leukocytes,Ua: NEGATIVE
Nitrite: NEGATIVE
Protein, ur: NEGATIVE mg/dL
Specific Gravity, Urine: 1.011 (ref 1.005–1.030)
pH: 7 (ref 5.0–8.0)

## 2020-10-28 LAB — CBC
HCT: 42.4 % (ref 39.0–52.0)
Hemoglobin: 14.4 g/dL (ref 13.0–17.0)
MCH: 30.3 pg (ref 26.0–34.0)
MCHC: 34 g/dL (ref 30.0–36.0)
MCV: 89.3 fL (ref 80.0–100.0)
Platelets: 214 10*3/uL (ref 150–400)
RBC: 4.75 MIL/uL (ref 4.22–5.81)
RDW: 12.5 % (ref 11.5–15.5)
WBC: 5.5 10*3/uL (ref 4.0–10.5)
nRBC: 0 % (ref 0.0–0.2)

## 2020-10-28 LAB — TROPONIN I (HIGH SENSITIVITY)
Troponin I (High Sensitivity): 2 ng/L (ref <18)
Troponin I (High Sensitivity): <2 ng/L (ref <18)

## 2020-10-28 LAB — BASIC METABOLIC PANEL
Anion gap: 5 (ref 5–15)
BUN: 10 mg/dL (ref 6–20)
CO2: 29 mmol/L (ref 22–32)
Calcium: 9.7 mg/dL (ref 8.9–10.3)
Chloride: 102 mmol/L (ref 98–111)
Creatinine, Ser: 0.82 mg/dL (ref 0.61–1.24)
GFR, Estimated: >60 mL/min (ref >60)
Glucose, Bld: 93 mg/dL (ref 70–99)
Potassium: 3.9 mmol/L (ref 3.5–5.1)
Sodium: 136 mmol/L (ref 135–145)

## 2020-10-28 LAB — LIPASE, BLOOD: Lipase: 39 U/L (ref 11–51)

## 2020-10-28 LAB — HEPATIC FUNCTION PANEL
ALT: 26 U/L (ref 0–44)
AST: 21 U/L (ref 15–41)
Albumin: 4.2 g/dL (ref 3.5–5.0)
Alkaline Phosphatase: 42 U/L (ref 38–126)
Bilirubin, Direct: 0.1 mg/dL (ref 0.0–0.2)
Indirect Bilirubin: 0.7 mg/dL (ref 0.3–0.9)
Total Bilirubin: 0.8 mg/dL (ref 0.3–1.2)
Total Protein: 7.4 g/dL (ref 6.5–8.1)

## 2020-10-28 LAB — D-DIMER, QUANTITATIVE: D-Dimer, Quant: 0.4 ug/mL-FEU (ref 0.00–0.50)

## 2020-10-28 MED ORDER — METHOCARBAMOL 500 MG PO TABS: 500.0000 mg | ORAL_TABLET | Freq: Two times a day (BID) | ORAL | 0 refills | Status: AC

## 2020-10-28 MED ORDER — HYDROCODONE-ACETAMINOPHEN 5-325 MG PO TABS
1.0000 | ORAL_TABLET | Freq: Once | ORAL | Status: AC
  Administered 2020-10-28: 1 via ORAL
  Filled 2020-10-28: qty 1

## 2020-10-28 MED ORDER — NAPROXEN 500 MG PO TABS: 500.0000 mg | ORAL_TABLET | Freq: Two times a day (BID) | ORAL | 0 refills | Status: AC

## 2020-10-28 MED ORDER — LIDOCAINE 5 % EX PTCH
1.0000 | MEDICATED_PATCH | CUTANEOUS | Status: DC
  Administered 2020-10-28: 1 via TRANSDERMAL
  Filled 2020-10-28: qty 1

## 2020-10-28 NOTE — ED Triage Notes (Signed)
Pt reports lower back pain since Wednesday while at work, pt thought he pulled a muscle, has been taking ibuprofen without relief. 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 Pt a.o, resp e.u

## 2020-10-28 NOTE — ED Provider Notes (Signed)
[facility] EMERGENCY DEPARTMENT Provider Note   CSN: [id number] Arrival date & time: 10/28/20  8469     History Chief Complaint  Patient presents with  . Back Pain  . Chest Pain    Bryan Ortega is a 50 y.o. male with no significant past medical history who presents to the ED due to right-sided thoracic back pain that radiates to right side of chest x 2 days. Patient states back pain started at work 2 days ago when he was reaching for his gun. He was evaluated by a work Charity fundraiser who believed patient strained his back. He notes he has been taking ibuprofen and using topical medication with no relief. Denies overlying rash in region.  Denies history of blood clots, recent surgeries, recent long immobilizations, hormonal treatments.  No lower extremity edema.  Denies saddle paresthesias, bowel/bladder incontinence, lower extremity numbness/tingling, lower extremity weakness. Chest pain located in right side of chest below pectoral muscle. Pain is intermittent. No relationship to exertion or deep inspiration. Denies relationship to meals. No cough, fever, or chills. Pain is worse with movement. Denies tobacco use. No previous MI or CVA. No family history of early CAD.   History obtained from patient and past medical records. No interpreter used during encounter.   HPI: A 50 year old patient presents for evaluation of chest pain. Initial onset of pain was less than one hour ago. The patient's chest pain is sharp and is not worse with exertion. The patient's chest pain is not middle- or left-sided, is not well-localized, is not described as heaviness/pressure/tightness and does not radiate to the arms/jaw/neck. The patient does not complain of nausea and denies diaphoresis. The patient has no history of stroke, has no history of peripheral artery disease, has not smoked in the past 90 days, denies any history of treated diabetes, has no relevant family history of coronary artery disease  (first degree relative at less than age 36), is not hypertensive, has no history of hypercholesterolemia and does not have an elevated BMI (>=30).   Past Medical History:  Diagnosis Date  . Back pain     Patient Active Problem List   Diagnosis Date Noted  . Back pain     Past Surgical History:  Procedure Laterality Date  . BACK SURGERY    . LUMBAR LAMINECTOMY/DECOMPRESSION MICRODISCECTOMY  07/28/2011   Procedure: LUMBAR LAMINECTOMY/DECOMPRESSION MICRODISCECTOMY;  Surgeon: Hewitt Shorts;  Location: [facility] OR;  Service: Neurosurgery;  Laterality: Left;  Left lumbar five sacral one laminectomy and microdiskectomy  . MANDIBULAR HARDWARE REMOVAL N/A 06/03/2015   Procedure: REMOVAL ARCH BARS;  Surgeon: Suzanna Obey, MD;  Location: [facility];  Service: ENT;  Laterality: N/A;  . ORIF MANDIBULAR FRACTURE N/A 04/17/2015   Procedure: OPEN REDUCTION INTERNAL FIXATION (ORIF) MANDIBULAR FRACTURE;  Surgeon: Suzanna Obey, MD;  Location: [facility] OR;  Service: ENT;  Laterality: N/A;       No family history on file.  Social History   Tobacco Use  . Smoking status: Current Every Day Smoker    Packs/day: 0.02    Years: 20.00    Pack years: 0.40    Types: Cigarettes  . Smokeless tobacco: Never Used  Substance Use Topics  . Alcohol use: Yes    Comment: Drinks a beer daily  . Drug use: Yes    Types: Marijuana    Comment: LAst smoke marijuana on 05/25/2015    Home Medications Prior to Admission medications   Medication Sig Start Date End Date Taking?  Authorizing Provider  methocarbamol (ROBAXIN) 500 MG tablet Take 1 tablet (500 mg total) by mouth 2 (two) times daily. 04/09/18   Maczis, Elmer Sow, PA-C  naproxen (NAPROSYN) 500 MG tablet Take 1 tablet (500 mg total) by mouth 2 (two) times daily. 04/09/18   Maczis, Elmer Sow, PA-C    Allergies    Patient has no known allergies.  Review of Systems   Review of Systems  Constitutional: Negative for chills and fever.  Respiratory: Negative  for cough.   Cardiovascular: Positive for chest pain. Negative for leg swelling.  Gastrointestinal: Negative for abdominal pain, diarrhea, nausea and vomiting.  Genitourinary: Negative for dysuria.  Musculoskeletal: Positive for back pain.  All other systems reviewed and are negative.   Physical Exam Updated Vital Signs BP 117/66   Pulse (!) 49   Temp 98.9 F (37.2 C)   Resp 15   SpO2 100%   Physical Exam Vitals and nursing note reviewed.  Constitutional:      General: He is not in acute distress. HENT:     Head: Normocephalic.  Eyes:     Pupils: Pupils are equal, round, and reactive to light.  Cardiovascular:     Rate and Rhythm: Normal rate and regular rhythm.     Pulses: Normal pulses.     Heart sounds: Normal heart sounds. No murmur heard. No friction rub. No gallop.   Pulmonary:     Effort: Pulmonary effort is normal.     Breath sounds: Normal breath sounds.  Abdominal:     General: Abdomen is flat. There is no distension.     Palpations: Abdomen is soft.     Tenderness: There is no abdominal tenderness. There is no guarding or rebound.     Comments: No RUQ tenderness.  Musculoskeletal:     Cervical back: Neck supple.     Comments: No lower extremity edema. Negative homan sign bilaterally. No calf tenderness. No thoracic or lumbar midline tenderness. Tenderness throughout right-sided thoracic paraspinal region.   Skin:    General: Skin is warm and dry.     Comments: No overlying rash  Neurological:     General: No focal deficit present.  Psychiatric:        Mood and Affect: Mood normal.        Behavior: Behavior normal.     ED Results / Procedures / Treatments   Labs (all labs ordered are listed, but only abnormal results are displayed) Labs Reviewed  BASIC METABOLIC PANEL  CBC  HEPATIC FUNCTION PANEL  LIPASE, BLOOD  URINALYSIS, ROUTINE W REFLEX MICROSCOPIC  D-DIMER, QUANTITATIVE  TROPONIN I (HIGH SENSITIVITY)  TROPONIN I (HIGH SENSITIVITY)     EKG EKG Interpretation  Date/Time:  Friday October 28 2020 10:07:16 EST Ventricular Rate:  66 PR Interval:  292 QRS Duration: 90 QT Interval:  378 QTC Calculation: 396 R Axis:   85 Text Interpretation: Sinus rhythm with 1st degree A-V block Right atrial enlargement Septal infarct , age undetermined Abnormal ECG no change from previous Confirmed by Arby Barrette [phone]) on 10/28/2020 1:02:54 PM   Radiology DG Chest 2 View  Result Date: 10/28/2020 CLINICAL DATA:  Chest pain, intermittent shortness of breath EXAM: CHEST - 2 VIEW COMPARISON:  04/09/2018 FINDINGS: The heart size and mediastinal contours are within normal limits. No focal airspace consolidation, pleural effusion, or pneumothorax. The visualized skeletal structures are unremarkable. IMPRESSION: No active cardiopulmonary disease. Electronically Signed   By: Duanne Guess D.O.   On: 10/28/2020 10:46  Procedures Procedures   Medications Ordered in ED Medications  lidocaine (LIDODERM) 5 % 1 patch (1 patch Transdermal Patch Applied 10/28/20 1131)  HYDROcodone-acetaminophen (NORCO/VICODIN) 5-325 MG per tablet 1 tablet (1 tablet Oral Given 10/28/20 1131)    ED Course  I have reviewed the triage vital signs and the nursing notes.  Pertinent labs & imaging results that were available during my care of the patient were reviewed by me and considered in my medical decision making (see chart for details).    MDM Rules/Calculators/A&P HEAR Score: 1                       50 year old male presents to the ED due to right-sided thoracic back pain x2 days after injuring his back at work. Pain began to radiate to right-side of chest today. No associated with exertion or deep inspiration.  Patient denies history of blood clots, recent surgeries, recent long immobilizations, hormonal treatments.  Upon arrival, stable vitals.  Patient is afebrile, not tachycardic or hypoxic.  Patient in no acute distress and non-ill-appearing.   Physical exam significant for tenderness throughout right-side thoracic region. No midline tenderness. No overlying rash to suggest shingles. No lower extremity edema. Negative homan sign bilaterally. Low suspicion for PE/DVT. Abdomen soft, non-distended, and non-tender. No RUQ tenderness. Low suspicion for gallbladder etiology; however, given location of pain will obtain hepatic function panel and lipase. Routine labs, troponin, CXR, and EKG ordered at triage to rule out cardiac etiology. Suspect symptoms related to MSK etiology given tenderness on exam and pain started after straining at work. Lidoderm patch and Norco given for symptomatic relief.   Chest x-ray personally reviewed which is negative for signs of pneumonia, pneumothorax, or widened mediastinum. EKG personally reviewed which demonstrates sinus rhythm with 1st degree AV block which appears to be chronic. No acute ischemia.  CBC unremarkable no leukocytosis and normal hemoglobin.  Initial troponin normal at 2.  Will obtain delta troponin to rule out ACS.  BMP unremarkable with normal renal function no major electrolyte derangements. Hepatic function panel unremarkable. Doubt gallbladder/hepatic etiology. Lipase normal. Doubt pancreatitis.   D-dimer normal. Doubt PE/DVT. Delta troponin flat. Doubt ACS. Dissection was considered, but thought to be less likely given presentation, normal CXR, and normal d-dimer. Suspect symptoms related to MSK etiology. Patient remained bradycardic during his entire stay. He is currently asymptomatic and notes this is typical for him. No syncopal episodes. Patient discharged with symptomatic treatment. Strict ED precautions discussed with patient. Patient states understanding and agrees to plan. Patient discharged home in no acute distress and stable vitals  Discussed with Dr. Donnald Garre who agrees with assessment and plan.  Final Clinical Impression(s) / ED Diagnoses Final diagnoses:  Acute right-sided thoracic  back pain  Atypical chest pain    Rx / DC Orders ED Discharge Orders    None       Jesusita Oka 10/28/20 1422    Arby Barrette, MD 10/28/20 1515

## 2020-10-28 NOTE — Discharge Instructions (Addendum)
As discussed, all of your labs were reassuring today. Your cardiac markers were normal. I suspect your symptoms are related to musculoskeletal etiology. I am sending you home with pain medication and a muscle relaxer. Muscle relaxer can cause drowsiness, so do not drive or operate machinery while on the medication. Please follow-up with PCP if symptoms do not improve. Return to the ER for new or worsening symptoms.   You may also purchase over the counter lidoderm patches and Voltaren gel for added pain relief.

## 2021-07-25 ENCOUNTER — Emergency Department (HOSPITAL_COMMUNITY)
Admission: EM | Admit: 2021-07-25 | Discharge: 2021-07-25 | Disposition: A | Discharge status: Home / Self Care | Payer: BC Managed Care – PPO | Attending: Emergency Medicine | Admitting: Emergency Medicine

## 2021-07-25 ENCOUNTER — Encounter (HOSPITAL_COMMUNITY): Payer: Self-pay | Admitting: Emergency Medicine

## 2021-07-25 ENCOUNTER — Other Ambulatory Visit: Payer: Self-pay

## 2021-07-25 ENCOUNTER — Emergency Department (HOSPITAL_COMMUNITY): Payer: BC Managed Care – PPO

## 2021-07-25 DIAGNOSIS — M545 Low back pain, unspecified: Secondary | ICD-10-CM | POA: Diagnosis present

## 2021-07-25 DIAGNOSIS — F17210 Nicotine dependence, cigarettes, uncomplicated: Secondary | ICD-10-CM | POA: Diagnosis not present

## 2021-07-25 DIAGNOSIS — M5126 Other intervertebral disc displacement, lumbar region: Secondary | ICD-10-CM | POA: Insufficient documentation

## 2021-07-25 MED ORDER — CYCLOBENZAPRINE HCL 10 MG PO TABS
10.0000 mg | ORAL_TABLET | Freq: Once | ORAL | Status: AC
  Administered 2021-07-25: 10 mg via ORAL
  Filled 2021-07-25: qty 1

## 2021-07-25 MED ORDER — PREDNISONE 20 MG PO TABS
60.0000 mg | ORAL_TABLET | Freq: Once | ORAL | Status: AC
  Administered 2021-07-25: 60 mg via ORAL
  Filled 2021-07-25: qty 3

## 2021-07-25 MED ORDER — CYCLOBENZAPRINE HCL 10 MG PO TABS: 10.0000 mg | ORAL_TABLET | Freq: Two times a day (BID) | ORAL | 0 refills | Status: AC | PRN

## 2021-07-25 MED ORDER — OXYCODONE-ACETAMINOPHEN 5-325 MG PO TABS: 1.0000 | ORAL_TABLET | Freq: Four times a day (QID) | ORAL | 0 refills | Status: DC | PRN

## 2021-07-25 MED ORDER — METHYLPREDNISOLONE 4 MG PO TBPK: ORAL_TABLET | ORAL | 0 refills | Status: DC

## 2021-07-25 MED ORDER — OXYCODONE-ACETAMINOPHEN 5-325 MG PO TABS
1.0000 | ORAL_TABLET | Freq: Once | ORAL | Status: AC
  Administered 2021-07-25: 1 via ORAL
  Filled 2021-07-25: qty 1

## 2021-07-25 MED ORDER — KETOROLAC TROMETHAMINE 60 MG/2ML IM SOLN
60.0000 mg | Freq: Once | INTRAMUSCULAR | Status: AC
  Administered 2021-07-25: 60 mg via INTRAMUSCULAR
  Filled 2021-07-25: qty 2

## 2021-07-25 NOTE — ED Notes (Signed)
Pt teaching provided on medications that may cause drowsiness. Pt instructed not to drive or operate heavy machinery while taking the prescribed medication. Pt verbalized understanding.  ? ?Pt provided discharge instructions and prescription information. Pt was given the opportunity to ask questions and questions were answered. Discharge signature not obtained in the setting of the COVID-19 pandemic in order to reduce high touch surfaces.  ? ?

## 2021-07-25 NOTE — ED Provider Notes (Signed)
[facility] EMERGENCY DEPARTMENT Provider Note   CSN: [id number] Arrival date & time: 07/25/21  0620     History Chief Complaint  Patient presents with   Back Pain    Bryan Ortega is a 50 y.o. male who presents to the ED today with complaint of gradual onset, constant, dull/achy, lower back pain radiating down LLE for the past 2-3 days.  Patient reports that he slept on the couch for a couple of hours Saturday and woke up with pain that is worsened with movement and sitting down.  He states he went to [facility] regional was prescribed muscle relaxers which she has been taking without relief of his symptoms.  He does report history of back surgery in 2012, he is unsure exactly what back surgery had however states that his symptoms feel similar to then.  Per chart review patient with lumbar laminectomy/decompression microdiscectomy in 2012.  He denies any fevers, chills, saddle anesthesia, urinary retention, urinary or bowel incontinence, any other associated symptoms. Denies trauma to the back.   The history is provided by the patient and medical records.      Past Medical History:  Diagnosis Date   Back pain     Patient Active Problem List   Diagnosis Date Noted   Back pain     Past Surgical History:  Procedure Laterality Date   BACK SURGERY     LUMBAR LAMINECTOMY/DECOMPRESSION MICRODISCECTOMY  07/28/2011   Procedure: LUMBAR LAMINECTOMY/DECOMPRESSION MICRODISCECTOMY;  Surgeon: Hewitt Shorts;  Location: [facility] OR;  Service: Neurosurgery;  Laterality: Left;  Left lumbar five sacral one laminectomy and microdiskectomy   MANDIBULAR HARDWARE REMOVAL N/A 06/03/2015   Procedure: REMOVAL ARCH BARS;  Surgeon: Suzanna Obey, MD;  Location: [facility];  Service: ENT;  Laterality: N/A;   ORIF MANDIBULAR FRACTURE N/A 04/17/2015   Procedure: OPEN REDUCTION INTERNAL FIXATION (ORIF) MANDIBULAR FRACTURE;  Surgeon: Suzanna Obey, MD;  Location: [facility] OR;  Service: ENT;   Laterality: N/A;       History reviewed. No pertinent family history.  Social History   Tobacco Use   Smoking status: Every Day    Packs/day: 0.02    Years: 20.00    Pack years: 0.40    Types: Cigarettes   Smokeless tobacco: Never  Substance Use Topics   Alcohol use: Yes    Comment: Drinks a beer daily   Drug use: Yes    Types: Marijuana    Comment: LAst smoke marijuana on 05/25/2015    Home Medications Prior to Admission medications   Medication Sig Start Date End Date Taking? Authorizing Provider  cyclobenzaprine (FLEXERIL) 10 MG tablet Take 1 tablet (10 mg total) by mouth 2 (two) times daily as needed for muscle spasms. 07/25/21  Yes Tyreona Panjwani, PA-C  methylPREDNISolone (MEDROL DOSEPAK) 4 MG TBPK tablet Follow package insert 07/25/21  Yes Kemaya Dorner, PA-C  oxyCODONE-acetaminophen (PERCOCET/ROXICET) 5-325 MG tablet Take 1 tablet by mouth every 6 (six) hours as needed for severe pain. 07/25/21  Yes Deshauna Cayson, PA-C  methocarbamol (ROBAXIN) 500 MG tablet Take 1 tablet (500 mg total) by mouth 2 (two) times daily. 04/09/18   Maczis, Elmer Sow, PA-C  methocarbamol (ROBAXIN) 500 MG tablet Take 1 tablet (500 mg total) by mouth 2 (two) times daily. 10/28/20   Mannie Stabile, PA-C  naproxen (NAPROSYN) 500 MG tablet Take 1 tablet (500 mg total) by mouth 2 (two) times daily. 04/09/18   Maczis, Elmer Sow, PA-C  naproxen (NAPROSYN) 500 MG  tablet Take 1 tablet (500 mg total) by mouth 2 (two) times daily. 10/28/20   Mannie Stabile, PA-C    Allergies    Patient has no known allergies.  Review of Systems   Review of Systems  Constitutional:  Negative for chills and fever.  Genitourinary:  Negative for difficulty urinating.  Musculoskeletal:  Positive for arthralgias and back pain.  Skin:  Negative for color change.  Neurological:  Negative for weakness and numbness.  All other systems reviewed and are negative.  Physical Exam Updated Vital Signs BP (!) 142/94 (BP  Location: Right Arm)   Pulse 69   Temp 98.2 F (36.8 C) (Oral)   Resp 18   Ht 6\' 1"  (1.854 m)   Wt 74.8 kg   SpO2 99%   BMI 21.77 kg/m   Physical Exam Vitals and nursing note reviewed.  Constitutional:      Appearance: He is not ill-appearing or diaphoretic.  HENT:     Head: Normocephalic and atraumatic.  Eyes:     Conjunctiva/sclera: Conjunctivae normal.  Cardiovascular:     Rate and Rhythm: Normal rate and regular rhythm.     Pulses: Normal pulses.  Pulmonary:     Effort: Pulmonary effort is normal.     Breath sounds: Normal breath sounds. No wheezing, rhonchi or rales.  Musculoskeletal:     Comments: No C or T midline spinal TTP. Surgical incision noted to lumbar spine; well healed without erythema or edema. TTP present to midline L spine as well as left paralumbar musculature TTP. + SLR on left side. Strength 5/5 to BLEs. Sensation intact throughout. 2+ patellar reflexes. 2+ PT pulses bilaterally.   Skin:    General: Skin is warm and dry.     Coloration: Skin is not jaundiced.  Neurological:     Mental Status: He is alert.    ED Results / Procedures / Treatments   Labs (all labs ordered are listed, but only abnormal results are displayed) Labs Reviewed - No data to display  EKG None  Radiology CT Lumbar Spine Wo Contrast  Result Date: 07/25/2021 CLINICAL DATA:  Low back pain. Previous surgery. New symptoms. Pain began 3 days ago. EXAM: CT LUMBAR SPINE WITHOUT CONTRAST TECHNIQUE: Multidetector CT imaging of the lumbar spine was performed without intravenous contrast administration. Multiplanar CT image reconstructions were also generated. COMPARISON:  Radiography 07/02/2012 FINDINGS: Segmentation: 5 lumbar type vertebral bodies. Alignment: Normal Vertebrae: No fracture or focal bone lesion. Paraspinal and other soft tissues: Normal Disc levels: No abnormality from T11-12 through L2-3. L3-4: Mild bulging of the disc.  No compressive stenosis. L4-5: Mild bulging of the  disc.  No compressive stenosis. L5-S1: Distant left laminotomy. Mild bulging of the disc towards the right. No compressive stenosis. IMPRESSION: No acute finding by CT. Mild non-compressive disc bulges at L3-4 and L4-5. Previous left laminotomy at L5-S1. Bulging of the disc towards the right but no apparent compressive stenosis. Electronically Signed   By: 07/04/2012 M.D.   On: 07/25/2021 08:54    Procedures Procedures   Medications Ordered in ED Medications  ketorolac (TORADOL) injection 60 mg (60 mg Intramuscular Given 07/25/21 0854)  cyclobenzaprine (FLEXERIL) tablet 10 mg (10 mg Oral Given 07/25/21 0853)  predniSONE (DELTASONE) tablet 60 mg (60 mg Oral Given 07/25/21 0853)  oxyCODONE-acetaminophen (PERCOCET/ROXICET) 5-325 MG per tablet 1 tablet (1 tablet Oral Given 07/25/21 1033)    ED Course  I have reviewed the triage vital signs and the nursing notes.  Pertinent  labs & imaging results that were available during my care of the patient were reviewed by me and considered in my medical decision making (see chart for details).    MDM Rules/Calculators/A&P                           50 year old male who presents to the ED today with complaint of lower back pain radiating down left lower extremity since sleeping on couch on Saturday.  No improvement with muscle relaxers prescribed during initial ED visit at [facility] regional couple of days ago.  On arrival to the ED today vitals are stable.  Patient appears to be no acute distress.  He is found standing still at the bedside as he reports worsening pain with sitting down.  He is noted to have midline lumbar spinal tenderness palpation as well as left paralumbar musculature tenderness palpation.  He is neurovascularly intact throughout.  He denies any flag symptoms today concerning for cauda equina, spinal epidural abscess, AAA.  He denies any specific trauma to the back.  He does have history of laminectomy and microdiscectomy in 2012 to the  lumbar region.  Given this we will plan for CT lumbar spine for further evaluation.  We will plan for pain medication at this time including Toradol, prednisone, Flexeril with reevaluation.   CT: IMPRESSION:  No acute finding by CT. Mild non-compressive disc bulges at L3-4 and  L4-5. Previous left laminotomy at L5-S1. Bulging of the disc towards  the right but no apparent compressive stenosis.   Pt without any concerning neurological symptoms. Do not feel he requires MRI at this time. Will have him follow up outpatient with neurosurgery for further eval.   After treatment with toradol, flexeril, prednisone, and oxycodone pt reports mild improvement in symptoms. He reports pain still worse with sitting down which is to be expected with herniated disc. Will discharge home with short course of pain meds, muscle relaxers, and steroids. Pt knows to call NS for further eval. He is instructed to return to the ED for any worsening symptoms. Stable for discharge.   This note was prepared using Dragon voice recognition software and may include unintentional dictation errors due to the inherent limitations of voice recognition software.   Final Clinical Impression(s) / ED Diagnoses Final diagnoses:  Lumbar herniated disc    Rx / DC Orders ED Discharge Orders          Ordered    methylPREDNISolone (MEDROL DOSEPAK) 4 MG TBPK tablet        07/25/21 1149    cyclobenzaprine (FLEXERIL) 10 MG tablet  2 times daily PRN        07/25/21 1149    oxyCODONE-acetaminophen (PERCOCET/ROXICET) 5-325 MG tablet  Every 6 hours PRN        07/25/21 1149             Discharge Instructions      Please pick up medications and take as prescribed. DO NOT DRIVE OR DRINK ALCOHOL WHILE ON THE MUSCLE RELAXER/NARCOTIC PAIN MEDICATION AS IT CAN MAKE YOU DROWSY.   You can also buy salon pas patches OTC to apply to your back to help with pain.   Follow up with [facility] for further  evaluation  Return to the ED IMMEDIATELY for any new/worsening symptoms including worsening pain, numbness in your groin, inability to urinate, peeing or pooping on yourself, or any other new/concerning symptoms.  Tanda Rockers, PA-C 07/25/21 1151    Gloris Manchester, MD 07/26/21 [phone]

## 2021-07-25 NOTE — Discharge Instructions (Signed)
Please pick up medications and take as prescribed. DO NOT DRIVE OR DRINK ALCOHOL WHILE ON THE MUSCLE RELAXER/NARCOTIC PAIN MEDICATION AS IT CAN MAKE YOU DROWSY.   You can also buy salon pas patches OTC to apply to your back to help with pain.   Follow up with [facility] for further evaluation  Return to the ED IMMEDIATELY for any new/worsening symptoms including worsening pain, numbness in your groin, inability to urinate, peeing or pooping on yourself, or any other new/concerning symptoms.

## 2021-07-25 NOTE — ED Triage Notes (Signed)
Pt presents to ED POV. Pt c/o lower back pain. Pt reports that it began on Saturday after sleeping on the couch.

## 2023-06-06 ENCOUNTER — Other Ambulatory Visit: Payer: Self-pay

## 2023-06-06 ENCOUNTER — Emergency Department (HOSPITAL_COMMUNITY): Payer: Managed Care, Other (non HMO)

## 2023-06-06 ENCOUNTER — Emergency Department (HOSPITAL_COMMUNITY)
Admission: EM | Admit: 2023-06-06 | Discharge: 2023-06-06 | Disposition: A | Discharge status: Home / Self Care | Payer: Managed Care, Other (non HMO) | Attending: Emergency Medicine | Admitting: Emergency Medicine

## 2023-06-06 DIAGNOSIS — M545 Low back pain, unspecified: Secondary | ICD-10-CM | POA: Insufficient documentation

## 2023-06-06 DIAGNOSIS — Y92410 Unspecified street and highway as the place of occurrence of the external cause: Secondary | ICD-10-CM | POA: Diagnosis not present

## 2023-06-06 DIAGNOSIS — M542 Cervicalgia: Secondary | ICD-10-CM | POA: Insufficient documentation

## 2023-06-06 LAB — COMPREHENSIVE METABOLIC PANEL
ALT: 25 U/L (ref 0–44)
AST: 23 U/L (ref 15–41)
Albumin: 4 g/dL (ref 3.5–5.0)
Alkaline Phosphatase: 43 U/L (ref 38–126)
Anion gap: 11 (ref 5–15)
BUN: 12 mg/dL (ref 6–20)
CO2: 24 mmol/L (ref 22–32)
Calcium: 9.6 mg/dL (ref 8.9–10.3)
Chloride: 103 mmol/L (ref 98–111)
Creatinine, Ser: 0.87 mg/dL (ref 0.61–1.24)
GFR, Estimated: >60 mL/min (ref >60)
Glucose, Bld: 101 mg/dL — ABNORMAL HIGH (ref 70–99)
Potassium: 4 mmol/L (ref 3.5–5.1)
Sodium: 138 mmol/L (ref 135–145)
Total Bilirubin: 1 mg/dL (ref 0.3–1.2)
Total Protein: 7.2 g/dL (ref 6.5–8.1)

## 2023-06-06 LAB — CBC
HCT: 38.5 % — ABNORMAL LOW (ref 39.0–52.0)
Hemoglobin: 13.6 g/dL (ref 13.0–17.0)
MCH: 30.2 pg (ref 26.0–34.0)
MCHC: 35.3 g/dL (ref 30.0–36.0)
MCV: 85.4 fL (ref 80.0–100.0)
Platelets: 203 10*3/uL (ref 150–400)
RBC: 4.51 MIL/uL (ref 4.22–5.81)
RDW: 12.8 % (ref 11.5–15.5)
WBC: 3.9 10*3/uL — ABNORMAL LOW (ref 4.0–10.5)
nRBC: 0 % (ref 0.0–0.2)

## 2023-06-06 LAB — I-STAT CHEM 8, ED
BUN: 14 mg/dL (ref 6–20)
Calcium, Ion: 1.11 mmol/L — ABNORMAL LOW (ref 1.15–1.40)
Chloride: 104 mmol/L (ref 98–111)
Creatinine, Ser: 0.9 mg/dL (ref 0.61–1.24)
Glucose, Bld: 103 mg/dL — ABNORMAL HIGH (ref 70–99)
HCT: 42 % (ref 39.0–52.0)
Hemoglobin: 14.3 g/dL (ref 13.0–17.0)
Potassium: 4.1 mmol/L (ref 3.5–5.1)
Sodium: 138 mmol/L (ref 135–145)
TCO2: 24 mmol/L (ref 22–32)

## 2023-06-06 LAB — SAMPLE TO BLOOD BANK

## 2023-06-06 LAB — ETHANOL: Alcohol, Ethyl (B): <10 mg/dL (ref <10)

## 2023-06-06 LAB — I-STAT CG4 LACTIC ACID, ED: Lactic Acid, Venous: 1.3 mmol/L (ref 0.5–1.9)

## 2023-06-06 LAB — PROTIME-INR
INR: 0.9 (ref 0.8–1.2)
Prothrombin Time: 12.8 s (ref 11.4–15.2)

## 2023-06-06 MED ORDER — OXYCODONE-ACETAMINOPHEN 5-325 MG PO TABS: 1.0000 | ORAL_TABLET | Freq: Three times a day (TID) | ORAL | 0 refills | Status: AC | PRN

## 2023-06-06 MED ORDER — MORPHINE SULFATE (PF) 4 MG/ML IV SOLN
4.0000 mg | Freq: Once | INTRAVENOUS | Status: AC
  Administered 2023-06-06: 4 mg via INTRAVENOUS
  Filled 2023-06-06: qty 1

## 2023-06-06 MED ORDER — IOHEXOL 350 MG/ML SOLN
75.0000 mL | Freq: Once | INTRAVENOUS | Status: AC | PRN
  Administered 2023-06-06: 75 mL via INTRAVENOUS

## 2023-06-06 MED ORDER — ONDANSETRON HCL 4 MG PO TABS: 4.0000 mg | ORAL_TABLET | Freq: Four times a day (QID) | ORAL | 0 refills | Status: AC
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
## 2023-06-06 NOTE — Discharge Instructions (Signed)
Return for any problem.  ?
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
## 2023-06-06 NOTE — ED Notes (Signed)
EMS C-collar taken off per MD [facility]

## 2023-06-06 NOTE — ED Notes (Signed)
This RN took pt to CT 2
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
## 2024-05-01 ENCOUNTER — Emergency Department (HOSPITAL_BASED_OUTPATIENT_CLINIC_OR_DEPARTMENT_OTHER)

## 2024-05-01 ENCOUNTER — Emergency Department (HOSPITAL_COMMUNITY)

## 2024-05-01 ENCOUNTER — Other Ambulatory Visit: Payer: Self-pay

## 2024-05-01 ENCOUNTER — Encounter (HOSPITAL_BASED_OUTPATIENT_CLINIC_OR_DEPARTMENT_OTHER): Payer: Self-pay | Admitting: Emergency Medicine

## 2024-05-01 ENCOUNTER — Emergency Department (HOSPITAL_BASED_OUTPATIENT_CLINIC_OR_DEPARTMENT_OTHER)
Admission: EM | Admit: 2024-05-01 | Discharge: 2024-05-01 | Disposition: A | Discharge status: Home / Self Care | Source: Ambulatory Visit | Attending: Emergency Medicine | Admitting: Emergency Medicine

## 2024-05-01 DIAGNOSIS — Q282 Arteriovenous malformation of cerebral vessels: Secondary | ICD-10-CM

## 2024-05-01 DIAGNOSIS — F17210 Nicotine dependence, cigarettes, uncomplicated: Secondary | ICD-10-CM | POA: Diagnosis not present

## 2024-05-01 DIAGNOSIS — R519 Headache, unspecified: Secondary | ICD-10-CM | POA: Diagnosis not present

## 2024-05-01 DIAGNOSIS — R42 Dizziness and giddiness: Secondary | ICD-10-CM | POA: Diagnosis present

## 2024-05-01 DIAGNOSIS — R55 Syncope and collapse: Secondary | ICD-10-CM | POA: Insufficient documentation

## 2024-05-01 DIAGNOSIS — R930 Abnormal findings on diagnostic imaging of skull and head, not elsewhere classified: Secondary | ICD-10-CM | POA: Insufficient documentation

## 2024-05-01 DIAGNOSIS — I440 Atrioventricular block, first degree: Secondary | ICD-10-CM

## 2024-05-01 LAB — BASIC METABOLIC PANEL WITH GFR
Anion gap: 11 (ref 5–15)
BUN: 11 mg/dL (ref 6–20)
CO2: 24 mmol/L (ref 22–32)
Calcium: 9.4 mg/dL (ref 8.9–10.3)
Chloride: 103 mmol/L (ref 98–111)
Creatinine, Ser: 0.87 mg/dL (ref 0.61–1.24)
GFR, Estimated: >60 mL/min (ref >60)
Glucose, Bld: 81 mg/dL (ref 70–99)
Potassium: 4.3 mmol/L (ref 3.5–5.1)
Sodium: 138 mmol/L (ref 135–145)

## 2024-05-01 LAB — CBC WITH DIFFERENTIAL/PLATELET
Abs Immature Granulocytes: 0.02 K/uL (ref 0.00–0.07)
Basophils Absolute: 0 K/uL (ref 0.0–0.1)
Basophils Relative: 1 %
Eosinophils Absolute: 0.1 K/uL (ref 0.0–0.5)
Eosinophils Relative: 1 %
HCT: 39.4 % (ref 39.0–52.0)
Hemoglobin: 13.8 g/dL (ref 13.0–17.0)
Immature Granulocytes: 0 %
Lymphocytes Relative: 13 %
Lymphs Abs: 1 K/uL (ref 0.7–4.0)
MCH: 30.5 pg (ref 26.0–34.0)
MCHC: 35 g/dL (ref 30.0–36.0)
MCV: 87.2 fL (ref 80.0–100.0)
Monocytes Absolute: 0.5 K/uL (ref 0.1–1.0)
Monocytes Relative: 6 %
Neutro Abs: 6 K/uL (ref 1.7–7.7)
Neutrophils Relative %: 79 %
Platelets: 228 K/uL (ref 150–400)
RBC: 4.52 MIL/uL (ref 4.22–5.81)
RDW: 12.9 % (ref 11.5–15.5)
WBC: 7.5 K/uL (ref 4.0–10.5)
nRBC: 0 % (ref 0.0–0.2)

## 2024-05-01 LAB — MAGNESIUM: Magnesium: 2.5 mg/dL — ABNORMAL HIGH (ref 1.7–2.4)

## 2024-05-01 LAB — TSH: TSH: 1.66 u[IU]/mL (ref 0.350–4.500)

## 2024-05-01 LAB — T4, FREE: Free T4: 0.78 ng/dL (ref 0.61–1.12)

## 2024-05-01 LAB — TROPONIN T, HIGH SENSITIVITY: Troponin T High Sensitivity: <15 ng/L (ref 0–19)

## 2024-05-01 MED ORDER — SODIUM CHLORIDE 0.9 % IV BOLUS
1000.0000 mL | Freq: Once | INTRAVENOUS | Status: AC
  Administered 2024-05-01: 1000 mL via INTRAVENOUS

## 2024-05-01 MED ORDER — IOHEXOL 350 MG/ML SOLN
50.0000 mL | Freq: Once | INTRAVENOUS | Status: AC | PRN
  Administered 2024-05-01: 50 mL via INTRAVENOUS

## 2024-05-01 MED ORDER — GADOBUTROL 1 MMOL/ML IV SOLN
8.0000 mL | Freq: Once | INTRAVENOUS | Status: AC | PRN
  Administered 2024-05-01: 8 mL via INTRAVENOUS

## 2024-05-01 NOTE — Discharge Instructions (Addendum)
 1.You will be getting a call from Dr. 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  Additional information about this diagnosis has been placed in your discharge instructions. 2.  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  Treatment depends upon how big it is, where it is and if it is causing symptoms.  This is what you will be following up with Dr. Janjua to get further testing and discussion about management. 3.  Return to the Emergency Department immediately if you have any sudden worsening or concerning symptoms.  Return if you have any visual disturbance that lasts except briefly as you have been experiencing or any new type of neurologic problem.  Return if you have a severe or persisting headache. 4.  At this time since you have been experiencing some lightheadedness, do not use any heavy equipment or dangerous equipment until you have completed your evaluation.

## 2024-05-01 NOTE — ED Notes (Signed)
 Patient going POV to [facility] for MRI--Dr. Francesca accepting

## 2024-05-01 NOTE — ED Notes (Signed)
 Spoke to [facility] ED charge RN , made aware about patient's arrival via POV for MRI testing .

## 2024-05-01 NOTE — ED Triage Notes (Signed)
 Intermittent dizziness x 1 month , blurry vision , his symptoms got worse today at work and thought he was to lose his balance . Pt ambulatory with steady gait , alert and oriented x 4 .  Denies headache .

## 2024-05-01 NOTE — ED Notes (Signed)
 Pt leaving the department via POV to [place] for Brain MRI testing

## 2024-05-01 NOTE — ED Provider Notes (Signed)
 [facility] Provider Note  CSN: [id number] Arrival date & time: 05/01/24 9157  Chief Complaint(s) Dizziness and Near Syncope  HPI Bryan Ortega is a 53 y.o. male with past medical history as below, significant for back pain, does not seek regularly primary care who presents to the ED with complaint of light headed/near syncope  Patient reports over the past 2 months he is having intermittent paroxysms of lightheadedness, near syncope that lasts for a few seconds and resolve spontaneously.  Unable to identify alleviating or exacerbating factors.  Thinks it might possibly be secondary to exertion or moving from a seated to a standing position.  He reports the symptoms will come on all of a sudden, feels like his very vision is blurry and he has a slight headache and feels like he is going to pass out.  Symptoms last for under 30 seconds and resolved.  He has been like he has had more frequent episodes over the past month.  Last episode was this morning lasted for few seconds and then resolved.  He is currently symptomatic.  Denies any associated weakness or numbness to extremities, no chest pain or palpitations, nausea, vomiting or diaphoresis.  No syncope.  Has not seen primary care for these complaints.  Does not follow primary care regularly.  Does not take regular medications. Reports does drink ETOH regularly, last etoh yestd, does not appear to be in acute etoh withdrawal  Past Medical History Past Medical History:  Diagnosis Date   Back pain    Patient Active Problem List   Diagnosis Date Noted   Back pain    Home Medication(s) Prior to Admission medications   Medication Sig Start Date End Date Taking? Authorizing Provider  cyclobenzaprine  (FLEXERIL ) 10 MG tablet Take 1 tablet (10 mg total) by mouth 2 (two) times daily as needed for muscle spasms. 07/25/21   Shepard, Margaux, PA-C  methocarbamol  (ROBAXIN ) 500 MG tablet Take 1 tablet (500 mg  total) by mouth 2 (two) times daily. 04/09/18   Maczis, Michael M, PA-C  methocarbamol  (ROBAXIN ) 500 MG tablet Take 1 tablet (500 mg total) by mouth 2 (two) times daily. 10/28/20   Aberman, Caroline C, PA-C  methylPREDNISolone  (MEDROL  DOSEPAK) 4 MG TBPK tablet Follow package insert 07/25/21   Venter, Margaux, PA-C  naproxen  (NAPROSYN ) 500 MG tablet Take 1 tablet (500 mg total) by mouth 2 (two) times daily. 04/09/18   Maczis, Michael M, PA-C  naproxen  (NAPROSYN ) 500 MG tablet Take 1 tablet (500 mg total) by mouth 2 (two) times daily. 10/28/20   Aberman, Caroline C, PA-C  ondansetron  (ZOFRAN ) 4 MG tablet Take 1 tablet (4 mg total) by mouth every 6 (six) hours. 06/06/23   Laurice Maude BROCKS, MD  oxyCODONE -acetaminophen  (PERCOCET/ROXICET) 5-325 MG tablet Take 1 tablet by mouth every 6 (six) hours as needed for severe pain. 07/25/21   Shepard, Margaux, PA-C  oxyCODONE -acetaminophen  (PERCOCET/ROXICET) 5-325 MG tablet Take 1 tablet by mouth every 8 (eight) hours as needed for severe pain (pain score 7-10). 06/06/23   Laurice Maude BROCKS, MD  Past Surgical History Past Surgical History:  Procedure Laterality Date   BACK SURGERY     LUMBAR LAMINECTOMY/DECOMPRESSION MICRODISCECTOMY  07/28/2011   Procedure: LUMBAR LAMINECTOMY/DECOMPRESSION MICRODISCECTOMY;  Surgeon: Lamar LELON Peaches;  Location: [facility] OR;  Service: Neurosurgery;  Laterality: Left;  Left lumbar five sacral one laminectomy and microdiskectomy   MANDIBULAR HARDWARE REMOVAL N/A 06/03/2015   Procedure: REMOVAL ARCH BARS;  Surgeon: Norleen Notice, MD;  Location: [facility];  Service: ENT;  Laterality: N/A;   ORIF MANDIBULAR FRACTURE N/A 04/17/2015   Procedure: OPEN REDUCTION INTERNAL FIXATION (ORIF) MANDIBULAR FRACTURE;  Surgeon: Norleen Notice, MD;  Location: [facility];  Service: ENT;  Laterality: N/A;   Family History History  reviewed. No pertinent family history.  Social History Social History   Tobacco Use   Smoking status: Every Day    Current packs/day: 0.02    Average packs/day: (0.4 ttl pk-yrs)    Types: Cigarettes   Smokeless tobacco: Never  Substance Use Topics   Alcohol use: Yes    Comment: Drinks a beer daily   Drug use: Yes    Types: Marijuana    Comment: LAst smoke marijuana on 05/25/2015   Allergies Patient has no known allergies.  Review of Systems A thorough review of systems was obtained and all systems are negative except as noted in the HPI and PMH.   Physical Exam Vital Signs  I have reviewed the triage vital signs BP 131/87   Pulse 65   Temp 98.1 F (36.7 C)   Resp 16   Wt 81.6 kg   SpO2 98%   BMI 23.75 kg/m  Physical Exam Vitals and nursing note reviewed.  Constitutional:      General: He is not in acute distress.    Appearance: Normal appearance. He is well-developed.  HENT:     Head: Normocephalic and atraumatic.     Right Ear: External ear normal.     Left Ear: External ear normal.     Mouth/Throat:     Mouth: Mucous membranes are moist.  Eyes:     General: No visual field deficit or scleral icterus.       Right eye: No discharge.        Left eye: No discharge.     Extraocular Movements: Extraocular movements intact.     Conjunctiva/sclera: Conjunctivae normal.     Pupils: Pupils are equal, round, and reactive to light.  Cardiovascular:     Rate and Rhythm: Normal rate and regular rhythm.     Pulses: Normal pulses.     Heart sounds: Normal heart sounds.  Pulmonary:     Effort: Pulmonary effort is normal. No respiratory distress.     Breath sounds: Normal breath sounds.  Abdominal:     General: Abdomen is flat.     Palpations: Abdomen is soft.     Tenderness: There is no abdominal tenderness.  Musculoskeletal:     Cervical back: No rigidity.     Right lower leg: No edema.     Left lower leg: No edema.  Skin:    General: Skin is warm and dry.      Capillary Refill: Capillary refill takes less than 2 seconds.  Neurological:     Mental Status: He is alert and oriented to person, place, and time.     GCS: GCS eye subscore is 4. GCS verbal subscore is 5. GCS motor subscore is 6.     Cranial Nerves: Cranial nerves 2-12 are intact. No dysarthria  or facial asymmetry.     Sensory: Sensation is intact. No sensory deficit.     Motor: Motor function is intact. No tremor or pronator drift.     Coordination: Coordination is intact. Finger-Nose-Finger Test and Heel to Metcalf Test normal.     Gait: Gait is intact.     Comments: Strength 5/5 to BLUE/BLLE, equal and symmetric    Psychiatric:        Mood and Affect: Mood normal.        Behavior: Behavior normal.     ED Results and Treatments Labs (all labs ordered are listed, but only abnormal results are displayed) Labs Reviewed  MAGNESIUM - Abnormal; Notable for the following components:      Result Value   Magnesium 2.5 (*)    All other components within normal limits  CBC WITH DIFFERENTIAL/PLATELET  BASIC METABOLIC PANEL WITH GFR  TSH  T4, FREE  TROPONIN T, HIGH SENSITIVITY                                                                                                                          Radiology CT ANGIO HEAD NECK W WO CM Result Date: 05/01/2024 CLINICAL DATA:  53 year old male with vertigo.  Near syncope. EXAM: CT ANGIOGRAPHY HEAD AND NECK WITH AND WITHOUT CONTRAST TECHNIQUE: Multidetector CT imaging of the head and neck was performed using the standard protocol during bolus administration of intravenous contrast. Multiplanar CT image reconstructions and MIPs were obtained to evaluate the vascular anatomy. Carotid stenosis measurements (when applicable) are obtained utilizing NASCET criteria, using the distal internal carotid diameter as the denominator. RADIATION DOSE REDUCTION: This exam was performed according to the departmental dose-optimization program which includes automated  exposure control, adjustment of the mA and/or kV according to patient size and/or use of iterative reconstruction technique. CONTRAST:  50mL OMNIPAQUE  IOHEXOL  350 MG/ML SOLN COMPARISON:  Head CT 06/06/2023. FINDINGS: CT HEAD Brain: Cerebral volume remains normal. No midline shift, ventriculomegaly, mass effect, evidence of mass lesion, intracranial hemorrhage or evidence of cortically based acute infarction. Phillis Thackeray-white matter differentiation is within normal limits throughout the brain. Calvarium and skull base: Stable, intact. Paranasal sinuses: Visualized paranasal sinuses and mastoids are stable and well aerated. Tympanic cavities appear well aerated. Orbits: Visualized orbits and scalp soft tissues are within normal limits. CTA NECK Skeleton: Mild for age cervical spine degeneration. No acute osseous abnormality identified. Upper chest: Mild paraseptal emphysema in the anterior lung apices. Negative visible superior mediastinum. Other neck: Nonvascular neck soft tissue spaces appear negative. Aortic arch: Normal 3 vessel arch. Right carotid system: Patent with mild tortuosity. No plaque or stenosis. Left carotid system: Patent with mild tortuosity. Vertebral arteries: Cervical vertebral arteries are codominant and patent with moderate tortuosity in the neck. No proximal subclavian or cervical vertebral atherosclerosis or stenosis. CTA HEAD Posterior circulation: Codominant distal vertebral arteries and vertebrobasilar junction are patent without stenosis. Normal left PICA origin. Right PICA diminutive or absent. Patent basilar artery without stenosis.  Normal SCA and right PCA origins. Fetal type left PCA origin. Right posterior communicating artery diminutive or absent. Bilateral PCA branches are within normal limits. Anterior circulation: Both ICA siphons are patent without stenosis. Normal left posterior communicating artery origin. Patent carotid termini. Normal MCA and ACA origins. Dominant right A1. Normal  anterior communicating artery. Right ACA remains dominant distally. Bilateral ACA branches are within normal limits. MCA M1 segments and bifurcations are patent without stenosis. Right MCA bifurcates early. Otherwise asymmetric prominence of the left vein of Labbe, and asymmetric tangle of vessels along the anterior extent of that vein adjacent to the left MCA branches (series 21, image 39). However, no asymmetric enhancement of the transverse or sigmoid venous sinuses. And although left MCA branches do appear slightly hypertrophied compared to the right on series 21, image 39, the right ICA siphon appears more dominant. Otherwise MCA branches are within normal limits. Venous sinuses: Early contrast timing but grossly patent. Anatomic variants: Dominant right ACA A1. Fetal type left PCA origin. Review of the MIP images confirms the above findings IMPRESSION: 1. Negative for large vessel occlusion. No atherosclerosis or stenosis in the head or neck. Normal for age CT appearance of the brain. 2. Positive for mild asymmetric vascularity in the left middle cranial fossa. Difficult to exclude a small vascular malformation - AV or dural Fistula - in association with Left MCA branches and the Left vein of Labbe, although symmetry a of venous enhancement on this exam argues against. MRI brain (without and with contrast preferred) and/or Neuro Endovascular follow-up may be valuable. Electronically Signed   By: VEAR Hurst M.D.   On: 05/01/2024 11:39   DG Chest Portable 1 View Result Date: 05/01/2024 CLINICAL DATA:  Near-syncope EXAM: PORTABLE CHEST 1 VIEW COMPARISON:  Chest radiograph June 06, 2023 FINDINGS: The heart size is mildly enlarged. Mediastinal contours are within normal limits. Both lungs are clear. The visualized skeletal structures are unremarkable. IMPRESSION: No active disease. Mild cardiomegaly, stable to prior. Electronically Signed   By: Megan  Zare M.D.   On: 05/01/2024 11:22    Pertinent labs &  imaging results that were available during my care of the patient were reviewed by me and considered in my medical decision making (see MDM for details).  Medications Ordered in ED Medications  sodium chloride  0.9 % bolus 1,000 mL (0 mLs Intravenous Stopped 05/01/24 1238)  iohexol  (OMNIPAQUE ) 350 MG/ML injection 50 mL (50 mLs Intravenous Contrast Given 05/01/24 1112)                                                                                                                                     Procedures Procedures  (including critical care time)  Medical Decision Making / ED Course    Medical Decision Making:    Bryan Ortega is a 53 y.o. male with past medical history as below, significant for back pain, does not seek regularly primary  care who presents to the ED with complaint of light headed/near syncope. The complaint involves an extensive differential diagnosis and also carries with it a high risk of complications and morbidity.  Serious etiology was considered. Ddx includes but is not limited to: orthostatic hypotension, postural dizziness, arrhythmia, dehydration, electrolyte abnormality, thyroid dysfunction, infection, peripheral/central vertigo, etc  Complete initial physical exam performed, notably the patient was in NAD, asymptomatic.    Reviewed and confirmed nursing documentation for past medical history, family history, social history.  Vital signs reviewed.    Dizziness Near syncope  Abnormal CTA> - Intermittent paroxysms of lightheadedness, near syncope that are associated with mild headache, blurry vision binocular, intermittently over the past 2 months. Unclear provoking factors, seems to occur at random per pt. - Currently asymptomatic, he is neuro intact, no murmur w/ auscultation or arrhythmia noted on telemetry. - magnesium is mild elevated, labs o/w WNL - EKG non-ischemic, does show prolonged PR- seen on prior EKG - CTA head/neck:   >>> Positive for mild  asymmetric vascularity in the left middle cranial fossa. Difficult to exclude a small vascular malformation - AV or dural Fistula - in association with Left MCA branches and the Left vein of Labbe, although symmetry a of venous enhancement on this exam argues against. MRI brain (without and with contrast preferred) and/or Neuro Endovascular follow-up may be valuable.  - spoke w/ neurology Dr Matthews, recommends [facility] ED [facility] ED for MRI evaluation - pt agreeable to plan, spoke Dr Francesca who accepts pt  Dispo per team at [facility] following MRI, if this is negative echocardiogram and event monitor would be reasonable to evaluate for further etiology of near syncopal episodes                      Additional history obtained: -Additional history obtained from na -External records from outside source obtained and reviewed including: Chart review including previous notes, labs, imaging, consultation notes including  Prior ER eval Home meds   Lab Tests: -I ordered, reviewed, and interpreted labs.   The pertinent results include:   Labs Reviewed  MAGNESIUM - Abnormal; Notable for the following components:      Result Value   Magnesium 2.5 (*)    All other components within normal limits  CBC WITH DIFFERENTIAL/PLATELET  BASIC METABOLIC PANEL WITH GFR  TSH  T4, FREE  TROPONIN T, HIGH SENSITIVITY    Notable for mag elev  EKG   EKG Interpretation Date/Time:  Friday May 01 2024 09:57:27 EDT Ventricular Rate:  60 PR Interval:  378 QRS Duration:  93 QT Interval:  407 QTC Calculation: 407 R Axis:   72  Text Interpretation: Sinus rhythm Prolonged PR interval Consider left atrial enlargement similar to prior Confirmed by Elnor Savant ([id number]) on 05/01/2024 10:52:36 AM         Imaging Studies ordered: I ordered imaging studies including cxr, cta head/neck I independently visualized the following imaging with scope of interpretation limited to determining acute life  threatening conditions related to emergency care; findings noted above I agree with the radiologist interpretation If any imaging was obtained with contrast I closely monitored patient for any possible adverse reaction a/w contrast administration in the emergency department   Medicines ordered and prescription drug management: Meds ordered this encounter  Medications   sodium chloride  0.9 % bolus 1,000 mL   iohexol  (OMNIPAQUE ) 350 MG/ML injection 50 mL    -I have reviewed the patients home medicines and have made  adjustments as needed   Consultations Obtained: I requested consultation with dr darryl,  and discussed lab and imaging findings as well as pertinent plan - they recommend: MRI, [facility] ED to ED   Cardiac Monitoring: The patient was maintained on a cardiac monitor.  I personally viewed and interpreted the cardiac monitored which showed an underlying rhythm of: nsr Continuous pulse oximetry interpreted by myself, 99% on RA.    Social Determinants of Health:  Diagnosis or treatment significantly limited by social determinants of health: current smoker and alcohol use   Reevaluation: After the interventions noted above, I reevaluated the patient and found that they have stayed the same  Co morbidities that complicate the patient evaluation  Past Medical History:  Diagnosis Date   Back pain       Dispostion: Disposition decision including need for hospitalization was considered, and patient transferred.    Final Clinical Impression(s) / ED Diagnoses Final diagnoses:  Near syncope  Nonintractable episodic headache, unspecified headache type  Abnormal CT of the head        Elnor Jayson LABOR, DO 05/01/24 1259

## 2024-05-01 NOTE — ED Provider Notes (Signed)
 Patient transferred for MRI.  Patient reports for about a month he has been getting episodes of a quick onset of a more or less generalized headache and then associated blurred vision.  He reports that the symptoms resolved quickly and might wax and wane couple times.  Patient denies that the headache is localizing.  He denies has had any earache ear pain or neck pain.  Physical Exam  BP 135/75   Pulse (!) 57   Temp 97.7 F (36.5 C) (Oral)   Resp 20   Wt 81.6 kg   SpO2 100%   BMI 23.75 kg/m   Physical Exam Constitutional:      Comments: Well-nourished well-developed.  Alert nontoxic.  HENT:     Head: Normocephalic and atraumatic.     Comments: No tenderness of the scalp or posterior auricular area.    Nose: Nose normal.     Mouth/Throat:     Pharynx: Oropharynx is clear.  Eyes:     Extraocular Movements: Extraocular movements intact.  Neck:     Comments: No cervical lymphadenopathy.  Neck supple. Cardiovascular:     Rate and Rhythm: Normal rate and regular rhythm.     Heart sounds: Normal heart sounds.  Pulmonary:     Effort: Pulmonary effort is normal.     Breath sounds: Normal breath sounds.  Musculoskeletal:        General: Normal range of motion.     Cervical back: Neck supple.  Skin:    General: Skin is warm and dry.  Neurological:     General: No focal deficit present.     Mental Status: He is oriented to person, place, and time.     Motor: No weakness.     Coordination: Coordination normal.  Psychiatric:        Mood and Affect: Mood normal.      Procedures  Procedures  ED Course / MDM    Medical Decision Making Amount and/or Complexity of Data Reviewed Labs: ordered. Radiology: ordered.  Risk Prescription drug management.   I have personally reviewed MRI results. IMPRESSION: 1. Prominent superficial draining vein and multiple small arteries at the sylvian fissure, consistent with vascular malformation. No significant aneurysm or hemorrhage.  This is consistent with a small avm or dural fistula. 2. Left mastoid effusion.  Consult placed to Dr. Merrianne to review results and definitive management.  I have reviewed this with Dr. Lindzen.  He has contacted Dr. Janjua and Dr. Catarino office will contact the patient to schedule outpatient follow-up for further diagnostic and consultation.  I reviewed the diagnostic findings with the patient.  I have discussed the follow-up plan with the patient.  I have discussed return precautions and work restrictions with the patient.       Armenta Canning, MD 05/01/24 2218

## 2024-05-06 ENCOUNTER — Ambulatory Visit (INDEPENDENT_AMBULATORY_CARE_PROVIDER_SITE_OTHER): Admitting: Neuroradiology

## 2024-05-06 ENCOUNTER — Encounter: Payer: Self-pay | Admitting: Neuroradiology

## 2024-05-06 VITALS — BP 116/75 | HR 52 | Ht 73.0 in | Wt 186.0 lb

## 2024-05-06 DIAGNOSIS — G4459 Other complicated headache syndrome: Secondary | ICD-10-CM

## 2024-05-06 DIAGNOSIS — R519 Headache, unspecified: Secondary | ICD-10-CM | POA: Diagnosis not present

## 2024-05-06 DIAGNOSIS — R42 Dizziness and giddiness: Secondary | ICD-10-CM | POA: Diagnosis not present

## 2024-05-06 NOTE — Progress Notes (Signed)
 Progress Note: Referring Physician:  No referring provider defined for this encounter.  Primary Physician:  Patient, [name]  Chief Complaint: Dizziness, headaches  History of Present Illness: Bryan Ortega is a 53 y.o. male who presents with the chief complaint of episodes of dizziness, feeling like he is going to fall, associated with a pounding sensation in his head.  These have been going on for a couple of months.  They may occur 2 or 3 times a day, sometimes no times during the day.  He does not describe vertigo.  He has never passed out completely.  These usually occur when he is on his feet and has been up for a while, not orthostatic.  He does not have any audible pulsations.  When the symptoms resolve he does not have any residual deficits.  They are not associated with numbness or weakness of 1 side of the body or the other.  He does not see a doctor regularly.  He quit smoking several years ago.  Blood pressure today was 116/75.  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  Review of Systems:  Review of Systems  Respiratory:  Negative for shortness of breath.   Cardiovascular:  Negative for chest pain.    Exam: Today's Vitals   05/06/24 1517  BP: 116/75  Pulse: (!) 52  SpO2: 99%  Weight: 186 lb (84.4 kg)  Height: 6' 1 (1.854 m)   Body mass index is 24.54 kg/m.   Physical Exam Constitutional:      Appearance: Normal appearance. He is normal weight.  Cardiovascular:     Rate and Rhythm: Normal rate and regular rhythm.     Pulses: Normal pulses.     Heart sounds: Normal heart sounds.  Pulmonary:     Effort: Pulmonary effort is normal.     Breath sounds: Normal breath sounds.  Neurological:     Mental Status: He is alert.      NEUROLOGICAL:   Alert and oriented with normal speech expression, fluency and comprehension. Visual fields are full to confrontation.   Face is  symmetric. Strength in the arms and legs is symmetric with no drift.  Sensation is normal and symmetric. No ataxia. No inattention.   Imaging:  I reviewed his CT arteriogram and MRI from 05/01/2024 obtained for these symptoms.  I think both are normal.  The radiologists suggested the possibility of a vascular malformation, but I think this is unlikely.  Assessment:  Brief but frequent episodes of balance difficulty, near syncope associated with pounding headache.  Unclear etiology. These do not sound like partial seizures, nor did they sound like cardiac events.  Possible abnormality on CTA/MRI.  I have told him that I think it is unlikely that he has a vascular malformation and that I think the CTA and MRI studies are normal.  I have explained that I have a high degree of confidence in my interpretation, but that I am not 100% certain.  The only way to be 100% certain would be to do an arteriogram, which is an invasive test with a small but not zero risk.  I have also explained that even if he does have a small vascular malformation, it would not explain his symptoms and would be an unrelated incidental finding.  Recommendation:  1.  I have offered angiography, but I do not recommend it as I think it is highly likely to be normal 2.  He does not have a regular physician currently, but does have an appointment next week.  I suggested that he discuss his symptoms with his new physician.  His significant other tells me he has been under a great deal of stress.  The symptoms could be stress/anxiety related.  He has 3.  In the event he develops actual syncope, cardiology evaluation may be helpful.    I have not scheduled routine follow-up with me but I am glad to see him again as needed.

## 2024-08-25 ENCOUNTER — Ambulatory Visit (INDEPENDENT_AMBULATORY_CARE_PROVIDER_SITE_OTHER)

## 2024-08-25 ENCOUNTER — Other Ambulatory Visit: Payer: Self-pay

## 2024-08-25 VITALS — BP 100/70 | Ht 73.0 in | Wt 186.0 lb

## 2024-08-25 DIAGNOSIS — M25512 Pain in left shoulder: Secondary | ICD-10-CM | POA: Diagnosis not present

## 2024-08-25 DIAGNOSIS — G8929 Other chronic pain: Secondary | ICD-10-CM | POA: Diagnosis not present

## 2024-08-25 DIAGNOSIS — R42 Dizziness and giddiness: Secondary | ICD-10-CM

## 2024-08-25 DIAGNOSIS — M7502 Adhesive capsulitis of left shoulder: Secondary | ICD-10-CM

## 2024-08-25 DIAGNOSIS — M542 Cervicalgia: Secondary | ICD-10-CM | POA: Diagnosis not present

## 2024-08-25 MED ORDER — METHYLPREDNISOLONE ACETATE 40 MG/ML IJ SUSP
40.0000 mg | Freq: Once | INTRAMUSCULAR | Status: AC
  Administered 2024-08-25: 40 mg via INTRA_ARTICULAR

## 2024-08-25 NOTE — Addendum Note (Signed)
 Addended by: MARCINE HARLENE SAILOR on: 08/25/2024 10:25 AM   Modules accepted: Orders
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
## 2024-08-26 ENCOUNTER — Ambulatory Visit (INDEPENDENT_AMBULATORY_CARE_PROVIDER_SITE_OTHER)

## 2024-08-26 ENCOUNTER — Other Ambulatory Visit: Payer: Self-pay

## 2024-08-26 VITALS — BP 120/76 | Ht 73.0 in | Wt 186.0 lb

## 2024-08-26 DIAGNOSIS — M7918 Myalgia, other site: Secondary | ICD-10-CM

## 2024-08-26 MED ORDER — METHYLPREDNISOLONE ACETATE 40 MG/ML IJ SUSP
40.0000 mg | Freq: Once | INTRAMUSCULAR | Status: AC
  Administered 2024-08-26: 40 mg via INTRA_ARTICULAR

## 2024-08-26 NOTE — Progress Notes (Signed)
" ° °  Subjective:    Patient ID: Bryan Ortega, male    DOB: 54 y.o., 07/06/1971   MRN: [id number]  Chief Complaint: Neck pain - trigger point injection  History of Present Illness Patient evaluated by myself yesterday and diagnosed with myofascial pain in his paracervical and periscapular musculature particularly on the left side.  He presents today for a trigger point injection in this musculature.  Objective:   Vitals:   08/26/24 0939  BP: 120/76   Neck: There are several tender nodules and taut bands of tight muscle tissue with some spasm noted.  Paracervical musculature trigger point injection with Ultrasound Guidance Procedure Note Bryan Ortega 1971-07-26 Indications: Pain Procedure Details Following the description of risks including infection, bleeding, and damage to surrounding structures patient provided written consent for paracervical musculature injection with ultrasound guidance. Ultrasound was used to identify the paracervical musculature. The patient was then prepped in the usual fashion with chlorhexidine. Following topical anesthetization with ethyl chloride, the patient was injected into the paracervical musculature with a solution of 4 cc lidocaine  1%, 40 mg of Depo-Medrol , 1 cc sodium HCO3 4.2%. This was well visualized under ultrasound, please see associated photographic documentation. Patient tolerated well without complication. Precautions provided. Cleaned and dressing applied.     Assessment & Plan:   Assessment & Plan Patient tolerated procedure well.  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  Recommend he discuss potentially treating frozen shoulder with the PT he is scheduled to see this afternoon with [facility].  If they are unable to do this, instructed the patient to reach out to me and we will place a referral specifically to a therapist to target his shoulder.  Plan for follow-up in 6 weeks.   "

## 2024-10-07 ENCOUNTER — Ambulatory Visit
